# Patient Record
Sex: Female | Born: 1955 | Hispanic: Yes | Marital: Single | Smoking: Never smoker
Health system: Southern US, Community
[De-identification: ages and names within clinical notes are randomized; demographics above are authoritative.]

---

## 2010-05-03 HISTORY — PX: STOMACH SURGERY: SHX791

## 2021-01-10 ENCOUNTER — Emergency Department (HOSPITAL_COMMUNITY)
Admission: EM | Admit: 2021-01-10 | Discharge: 2021-01-10 | Disposition: A | Discharge status: Home / Self Care | Payer: Self-pay | Attending: Emergency Medicine | Admitting: Emergency Medicine

## 2021-01-10 ENCOUNTER — Encounter (HOSPITAL_COMMUNITY): Payer: Self-pay

## 2021-01-10 ENCOUNTER — Emergency Department (HOSPITAL_COMMUNITY): Payer: Self-pay

## 2021-01-10 ENCOUNTER — Other Ambulatory Visit: Payer: Self-pay

## 2021-01-10 DIAGNOSIS — R791 Abnormal coagulation profile: Secondary | ICD-10-CM | POA: Insufficient documentation

## 2021-01-10 DIAGNOSIS — R519 Headache, unspecified: Secondary | ICD-10-CM | POA: Insufficient documentation

## 2021-01-10 DIAGNOSIS — M4802 Spinal stenosis, cervical region: Secondary | ICD-10-CM | POA: Insufficient documentation

## 2021-01-10 LAB — CBC
HCT: 36 % (ref 36.0–46.0)
Hemoglobin: 11.1 g/dL — ABNORMAL LOW (ref 12.0–15.0)
MCH: 28 pg (ref 26.0–34.0)
MCHC: 30.8 g/dL (ref 30.0–36.0)
MCV: 90.7 fL (ref 80.0–100.0)
Platelets: 277 10*3/uL (ref 150–400)
RBC: 3.97 MIL/uL (ref 3.87–5.11)
RDW: 15 % (ref 11.5–15.5)
WBC: 4.9 10*3/uL (ref 4.0–10.5)
nRBC: 0 % (ref 0.0–0.2)

## 2021-01-10 LAB — URINALYSIS, MICROSCOPIC (REFLEX)

## 2021-01-10 LAB — DIFFERENTIAL
Abs Immature Granulocytes: 0.02 10*3/uL (ref 0.00–0.07)
Basophils Absolute: 0 10*3/uL (ref 0.0–0.1)
Basophils Relative: 0 %
Eosinophils Absolute: 0.1 10*3/uL (ref 0.0–0.5)
Eosinophils Relative: 2 %
Immature Granulocytes: 0 %
Lymphocytes Relative: 21 %
Lymphs Abs: 1 10*3/uL (ref 0.7–4.0)
Monocytes Absolute: 0.5 10*3/uL (ref 0.1–1.0)
Monocytes Relative: 10 %
Neutro Abs: 3.3 10*3/uL (ref 1.7–7.7)
Neutrophils Relative %: 67 %

## 2021-01-10 LAB — COMPREHENSIVE METABOLIC PANEL
ALT: 36 U/L (ref 0–44)
AST: 25 U/L (ref 15–41)
Albumin: 3.7 g/dL (ref 3.5–5.0)
Alkaline Phosphatase: 85 U/L (ref 38–126)
Anion gap: 3 — ABNORMAL LOW (ref 5–15)
BUN: 10 mg/dL (ref 8–23)
CO2: 27 mmol/L (ref 22–32)
Calcium: 8.4 mg/dL — ABNORMAL LOW (ref 8.9–10.3)
Chloride: 109 mmol/L (ref 98–111)
Creatinine, Ser: 0.58 mg/dL (ref 0.44–1.00)
GFR, Estimated: >60 mL/min (ref >60)
Glucose, Bld: 70 mg/dL (ref 70–99)
Potassium: 3.8 mmol/L (ref 3.5–5.1)
Sodium: 139 mmol/L (ref 135–145)
Total Bilirubin: 0.3 mg/dL (ref 0.3–1.2)
Total Protein: 6.6 g/dL (ref 6.5–8.1)

## 2021-01-10 LAB — URINALYSIS, ROUTINE W REFLEX MICROSCOPIC
Bilirubin Urine: NEGATIVE
Glucose, UA: >=500 mg/dL — AB
Hgb urine dipstick: NEGATIVE
Ketones, ur: NEGATIVE mg/dL
Leukocytes,Ua: NEGATIVE
Nitrite: POSITIVE — AB
Protein, ur: NEGATIVE mg/dL
Specific Gravity, Urine: 1.015 (ref 1.005–1.030)
pH: 6 (ref 5.0–8.0)

## 2021-01-10 LAB — PROTIME-INR
INR: 0.9 (ref 0.8–1.2)
Prothrombin Time: 12.5 seconds (ref 11.4–15.2)

## 2021-01-10 LAB — RAPID URINE DRUG SCREEN, HOSP PERFORMED
Amphetamines: NOT DETECTED
Barbiturates: NOT DETECTED
Benzodiazepines: NOT DETECTED
Cocaine: NOT DETECTED
Opiates: NOT DETECTED
Tetrahydrocannabinol: NOT DETECTED

## 2021-01-10 LAB — APTT: aPTT: 27 seconds (ref 24–36)

## 2021-01-10 IMAGING — MR MR HEAD WO/W CM
13 of 19 series · 33 of 48 positions shown · IV contrast (gadavist)
Comparison: CT from earlier the same day.

CLINICAL DATA: Initial evaluation for dizziness, neck pain,
headache, right upper extremity paresthesias and weakness.

EXAM:
MRI HEAD WITHOUT AND WITH CONTRAST
MRI CERVICAL SPINE WITHOUT AND WITH CONTRAST
TECHNIQUE: Multiplanar, multiecho pulse sequences of the brain and surrounding
structures, and cervical spine, to include the craniocervical
junction and cervicothoracic junction, were obtained without and
with intravenous contrast.
CONTRAST:  8.7mL GADAVIST GADOBUTROL 1 MMOL/ML IV SOLN

[Series 5: DWI · axial · 3.0mm · 0.88mm/px · z∈[-117,+33]mm · 5 of 104 slices shown (1 of 4)]
[im 1/104]
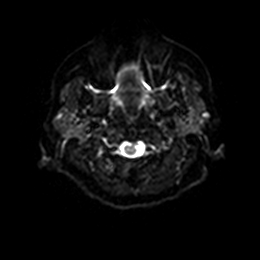
[im 26/104]
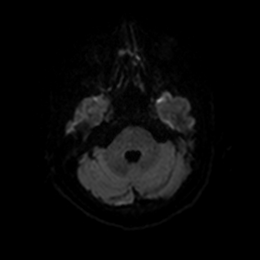
[im 52/104]
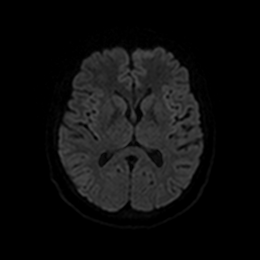
[im 78/104]
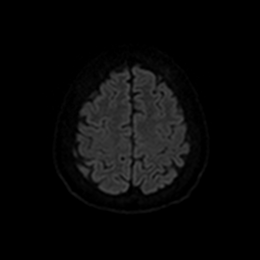
[im 104/104]
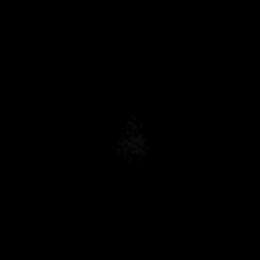

[Series 6: DWI · axial · 3.0mm · 0.88mm/px · z∈[-117,+33]mm · 3 of 52 slices shown (2 of 4)]
[im 1/52]
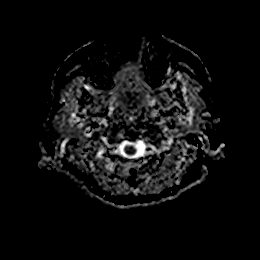
[im 26/52]
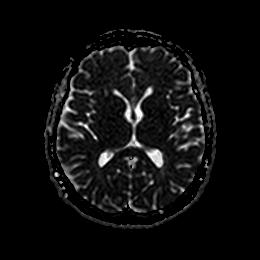
[im 52/52]
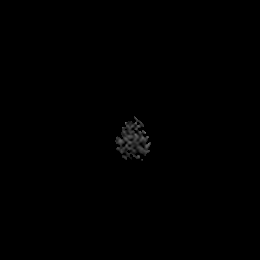

[Series 7: DWI · coronal · 4.0mm · 0.88mm/px · 4 of 70 slices shown (3 of 4)]
[im 1/70]
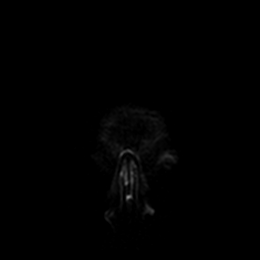
[im 24/70]
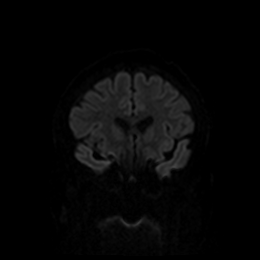
[im 47/70]
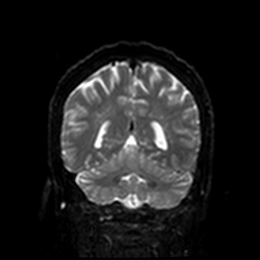
[im 70/70]
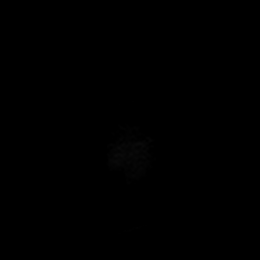

[Series 8: DWI · coronal · 4.0mm · 0.88mm/px · 2 of 35 slices shown (4 of 4)]
[im 1/35]
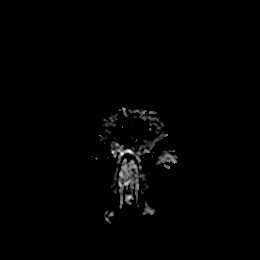
[im 35/35]
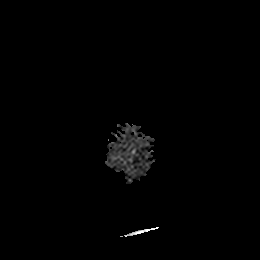

[Series 9: T1 · sagittal · 5.0mm · 0.75mm/px · 1 of 25 slices shown]
[im 1/25]
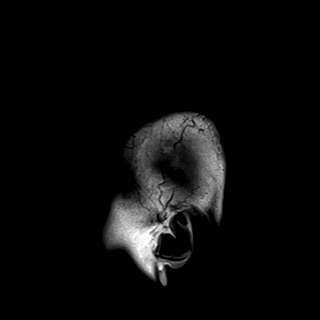

[Series 10: T2 · axial · 5.0mm · 0.72mm/px · 1 of 26 slices shown]
[im 1/26]
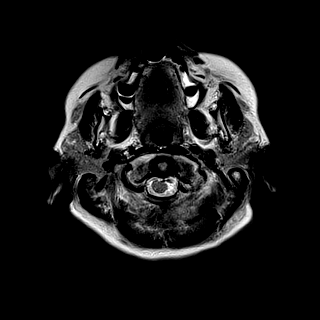

[Series 11: FLAIR · axial · 5.0mm · 0.45mm/px · 1 of 26 slices shown]
[im 1/26]
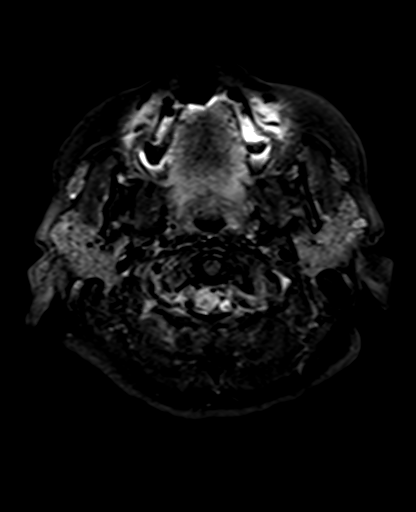

[Series 12: mag_images · axial · 3.0mm · 0.90mm/px · z∈[-117,+33]mm · 3 of 52 slices shown]
[im 1/52]
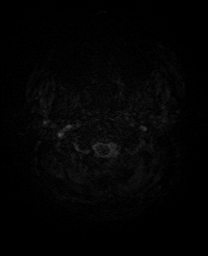
[im 26/52]
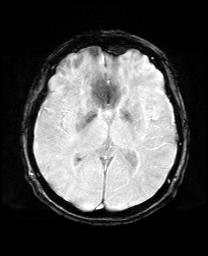
[im 52/52]
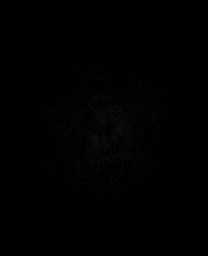

[Series 13: pha_images · axial · 3.0mm · 0.90mm/px · z∈[-117,+33]mm · 3 of 51 slices shown]
[im 1/51]
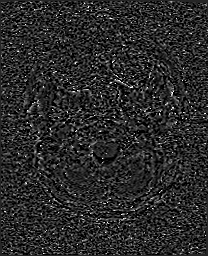
[im 26/51]
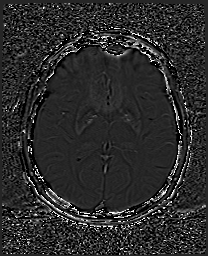
[im 51/51]
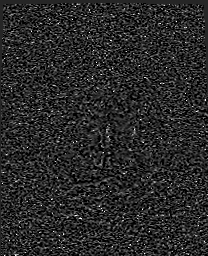

[Series 14: swi_images · axial · 3.0mm · 0.90mm/px · z∈[-117,+33]mm · 3 of 52 slices shown]
[im 1/52]
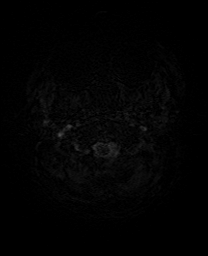
[im 26/52]
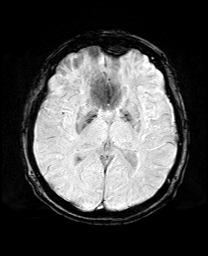
[im 52/52]
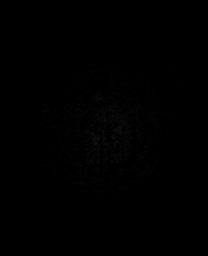

[Series 15: mip_images(sw) · axial · 24.0mm · 0.90mm/px · z∈[-106,+23]mm · 3 of 45 slices shown]
[im 1/45]
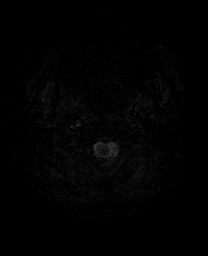
[im 23/45]
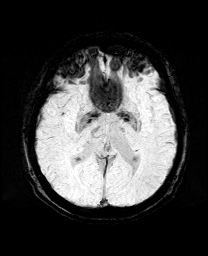
[im 45/45]
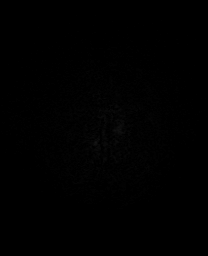

[Series 17: T2 post-contrast · coronal · 5.0mm · 0.72mm/px · 2 of 29 slices shown]
[im 1/29]
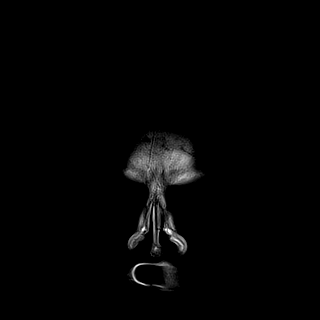
[im 29/29]
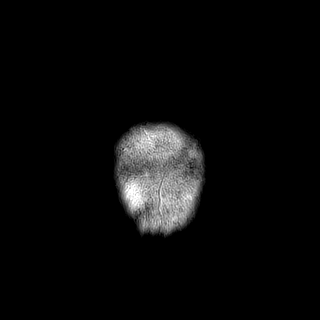

[Series 19: T1 post-contrast · coronal · 5.0mm · 0.34mm/px · 2 of 29 slices shown]
[im 1/29]
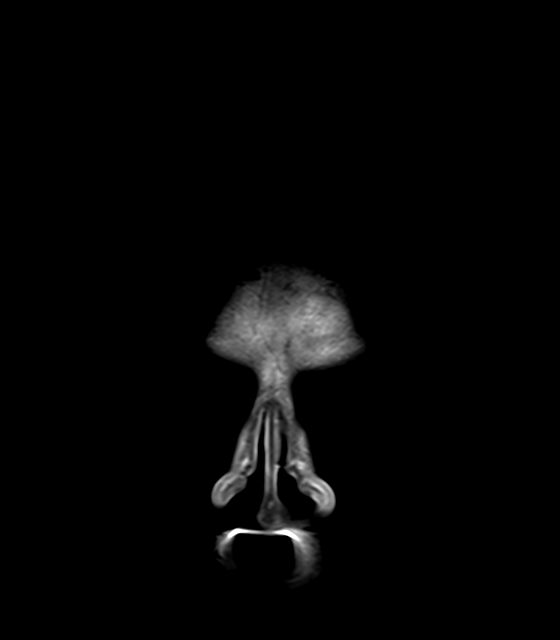
[im 29/29]
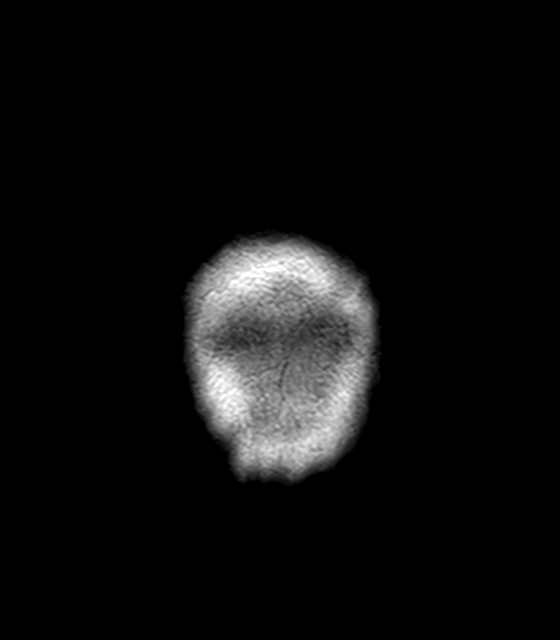

[33 of 48 positions shown; findings below may reference images not displayed]

FINDINGS: MRI HEAD FINDINGS

Brain: Cerebral volume within normal limits. Scattered patchy
T2/FLAIR hyperintensity seen involving the periventricular and deep
white matter both cerebral hemispheres, nonspecific, but most like
related to chronic microvascular ischemic disease, mild in nature.

No abnormal foci of restricted diffusion to suggest acute or
subacute ischemia. Gray-white matter differentiation maintained. No
encephalomalacia to suggest chronic cortical infarction. No evidence
for acute or chronic intracranial hemorrhage.

No mass lesion, midline shift or mass effect. No hydrocephalus or
extra-axial fluid collection. Partially empty sella. Suprasellar
region normal. Midline structures intact. No abnormal enhancement.

Vascular: Major intracranial vascular flow voids are maintained.

Skull and upper cervical spine: Craniocervical junction within
normal limits. Bone marrow signal intensity normal. No worrisome
focal marrow replacing lesion. No scalp soft tissue abnormality.

Sinuses/Orbits: Globes and orbital soft tissues within normal
limits. Scattered mucosal thickening noted throughout the paranasal
sinuses. No air-fluid levels to suggest acute sinusitis. Trace
bilateral mastoid effusions, of doubtful significance. Inner ear
structures grossly normal. Visualized nasopharynx unremarkable.

Other: 5 mm T2 hyperintense lesion noted within the right parotid
gland, of doubtful significance.

MRI CERVICAL SPINE FINDINGS

Alignment: Straightening of the normal cervical lordosis. No
listhesis.

Vertebrae: Vertebral body height maintained without acute or chronic
fracture. Bone marrow signal intensity mildly heterogeneous with a
few scattered benign hemangiomata noted. No worrisome osseous
lesions. No abnormal marrow edema or enhancement.

Cord: Normal signal and morphology.  No abnormal enhancement.

Posterior Fossa, vertebral arteries, paraspinal tissues: Visualized
brain and posterior fossa within normal limits. Craniocervical
junction normal. Paraspinous and prevertebral soft tissues within
normal limits. Normal intravascular flow voids seen within the
vertebral arteries bilaterally.

Disc levels:

C2-C3: Unremarkable.

C3-C4: Degenerative intervertebral disc space narrowing. Left
paracentral disc osteophyte complex indents the left ventral thecal
sac. Secondary mild flattening of the left ventral cord without cord
signal changes. Mild to moderate spinal stenosis. Mild to moderate
left C4 foraminal stenosis. Right neural foramina remains patent.

C4-C5: Degenerative intervertebral disc space narrowing. Broad-based
posterior disc osteophyte complex flattens and effaces the ventral
thecal sac. Moderate spinal stenosis with mild cord flattening, but
no cord signal changes. Mild left C5 foraminal stenosis. Right
neural foramina remains patent.

C5-C6: Degenerative intervertebral disc space narrowing. Left
paracentral disc osteophyte complex indents the left ventral thecal
sac. Mild spinal stenosis with minimal cord flattening, but no cord
signal changes. Mild to moderate left C6 foraminal narrowing. Right
neural foramina remains patent.

C6-C7: Degenerative intervertebral disc space narrowing. Broad-based
posterior disc osteophyte flattens and partially faces the ventral
thecal sac with no more than mild spinal stenosis. No cord
impingement. Mild left C7 foraminal stenosis. Right neural foramina
remains patent.

C7-T1: Negative interspace. Bilateral facet hypertrophy. No
stenosis.

T1-2: Seen only on sagittal projection. Minimal disc bulge with
facet hypertrophy. No stenosis.
IMPRESSION: MRI HEAD IMPRESSION:

1. No acute intracranial abnormality.
2. Mild cerebral white matter changes, nonspecific, but most like
related to chronic microvascular ischemic disease.

MRI CERVICAL SPINE IMPRESSION:

1. No acute abnormality within the cervical spine. No worrisome
osseous lesions by MRI.
2. Moderate multilevel cervical spondylosis with resultant mild to
moderate diffuse spinal stenosis at C3-4 through C6-7.
3. Mild to moderate left C4 through C7 foraminal stenosis as above.
No significant right-sided foraminal narrowing to explain patient's
right-sided symptoms.

## 2021-01-10 IMAGING — CT CT CERVICAL SPINE W/O CM
3 of 4 series · 13 of 33 positions shown, 16 images · non-contrast
Comparison: None.

CLINICAL DATA: Acute neck pain.

EXAM:
CT CERVICAL SPINE WITHOUT CONTRAST
TECHNIQUE: Multidetector CT imaging of the cervical spine was performed without
intravenous contrast. Multiplanar CT image reconstructions were also
generated.

[Series 3: c_spine 2.0 st · axial · 0.28mm/px · z∈[-149,-49]mm · 5 of 76 slices shown, 7 images]
[im 13/76  soft-tissue]
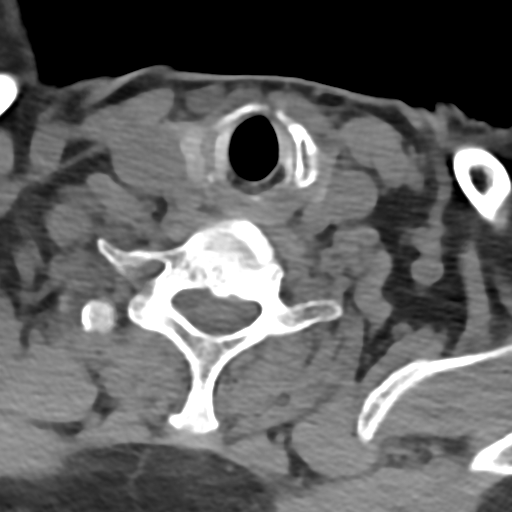
[im 13/76  bone]
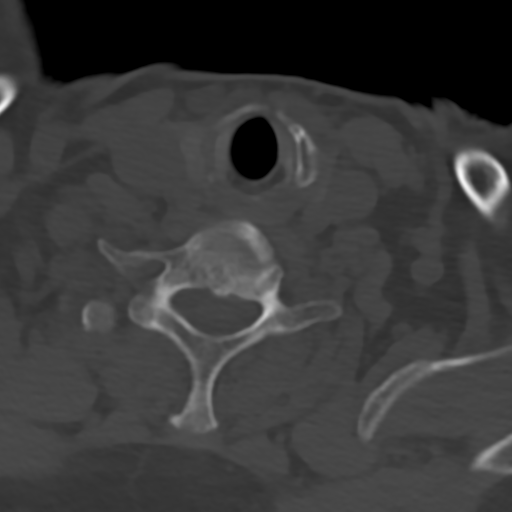
[im 26/76  bone]
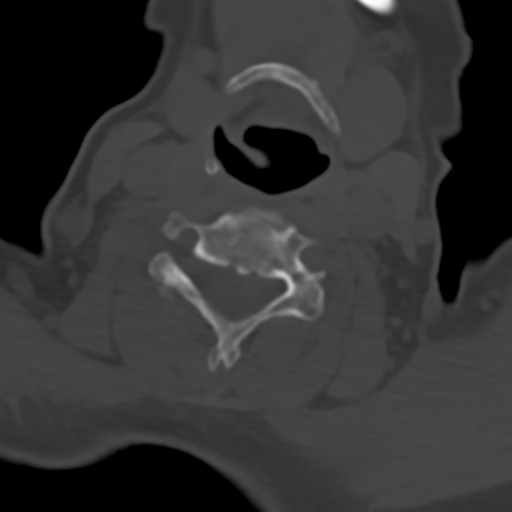
[im 38/76  bone]
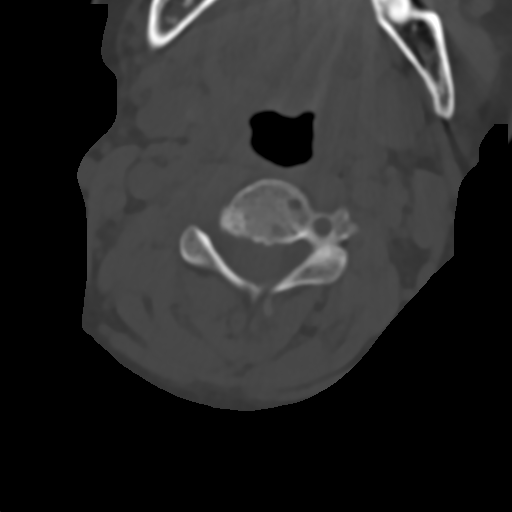
[im 51/76  bone]
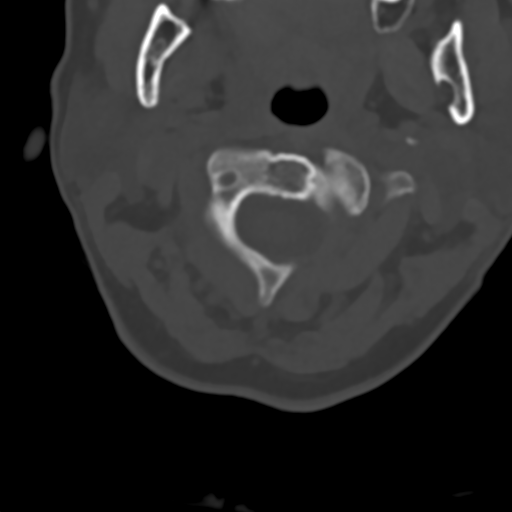
[im 63/76  soft-tissue]
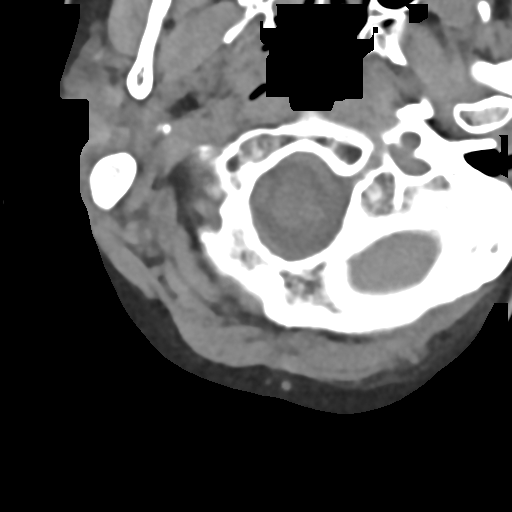
[im 63/76  bone]
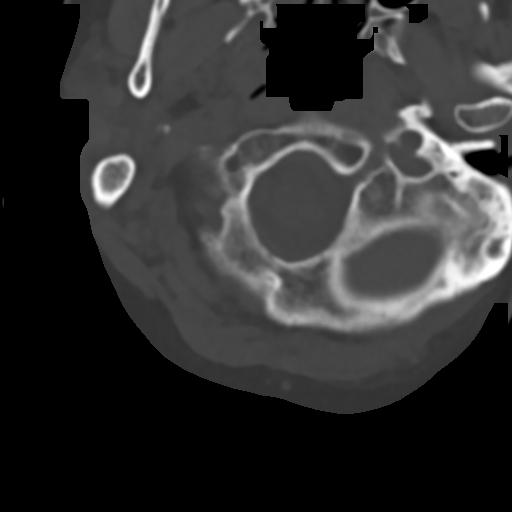

[Series 7: c_spine 2.0 sag bone · sagittal · 0.25mm/px · 5 of 61 slices shown, 6 images]
[im 21/61  bone]
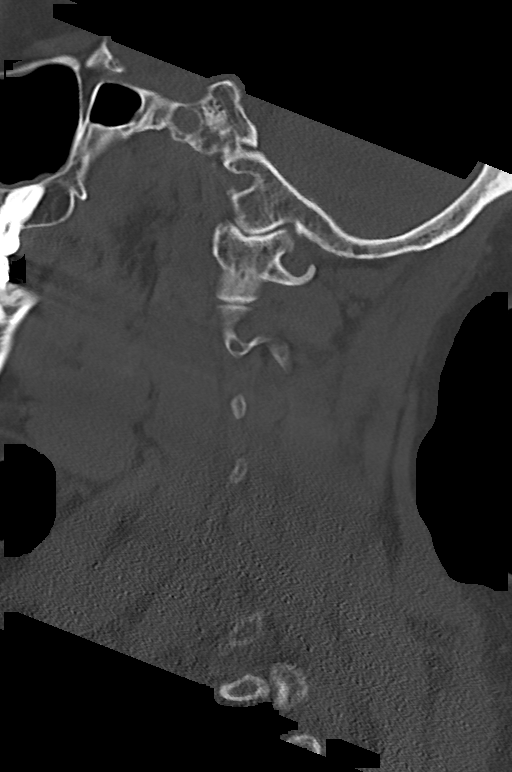
[im 26/61  bone]
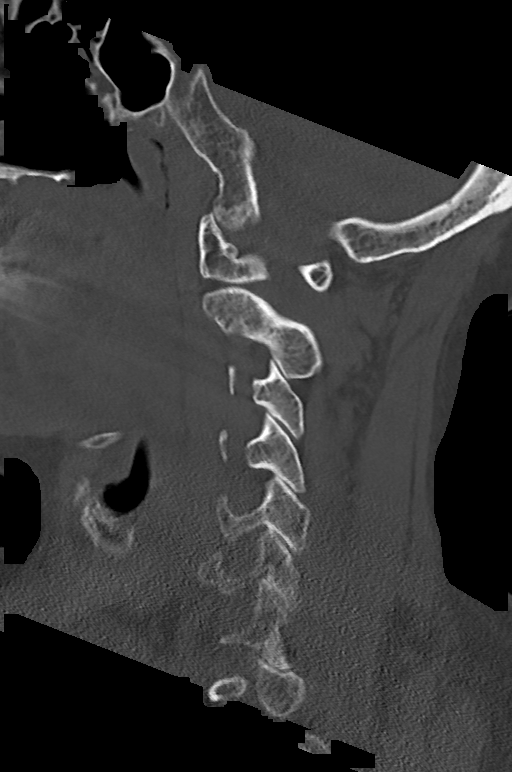
[im 31/61  soft-tissue]
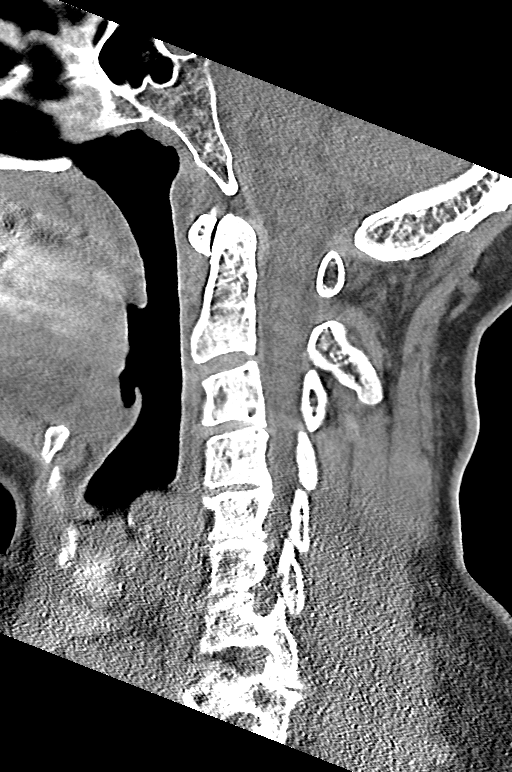
[im 31/61  bone]
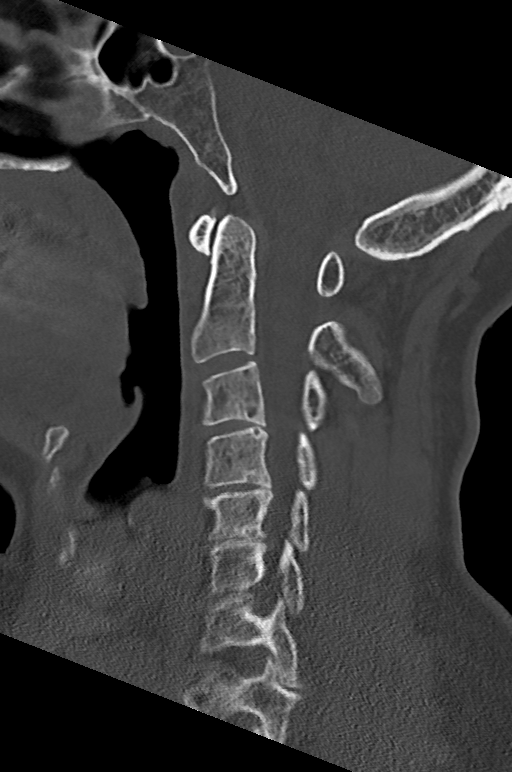
[im 36/61  bone]
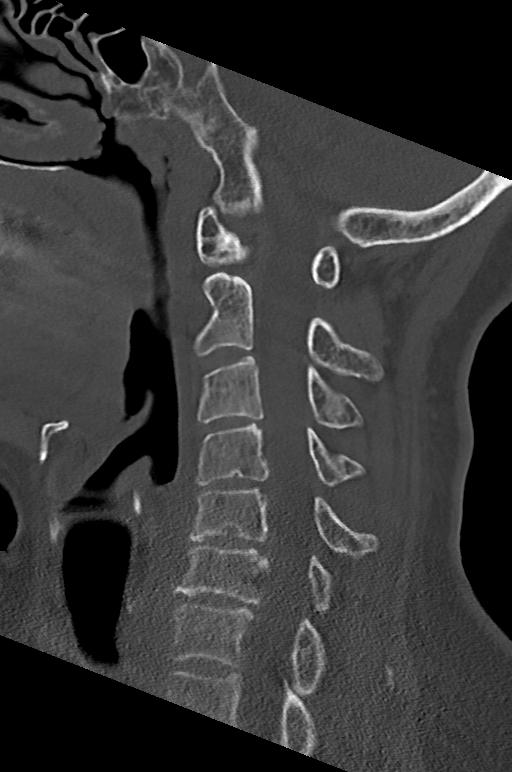
[im 41/61  bone]
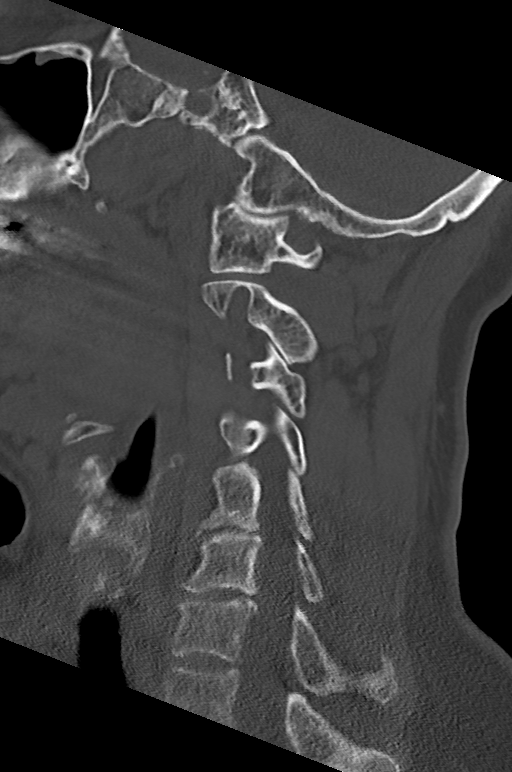

[Series 8: c_spine 2.0 cor bone · coronal · 0.27mm/px · 3 of 61 slices shown]
[im 13/61  bone]
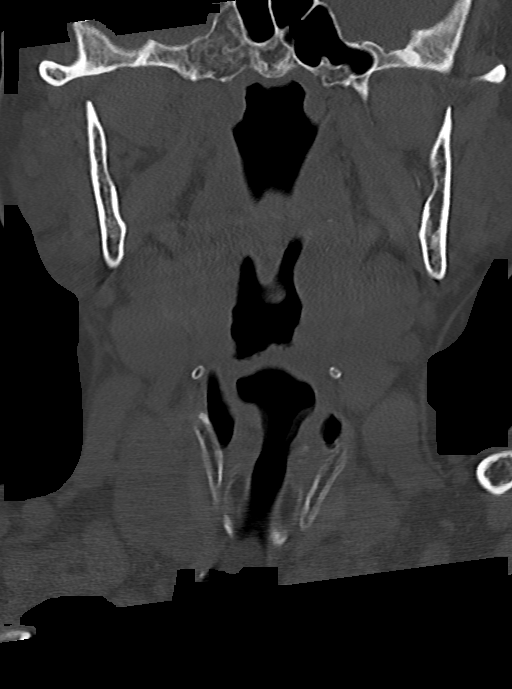
[im 25/61  bone]
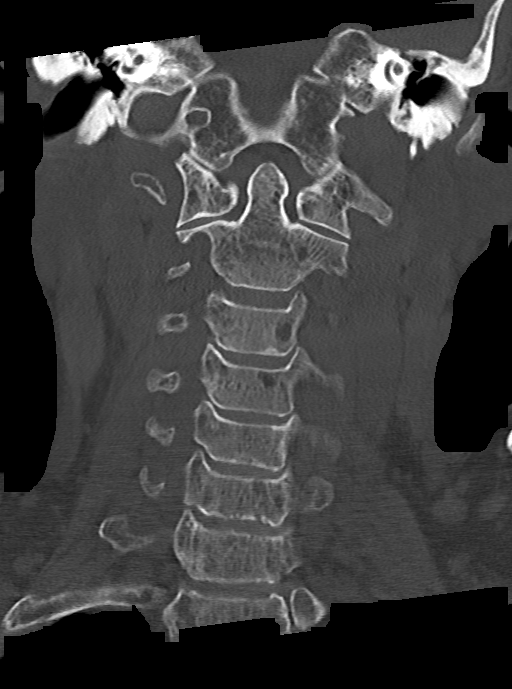
[im 37/61  bone]
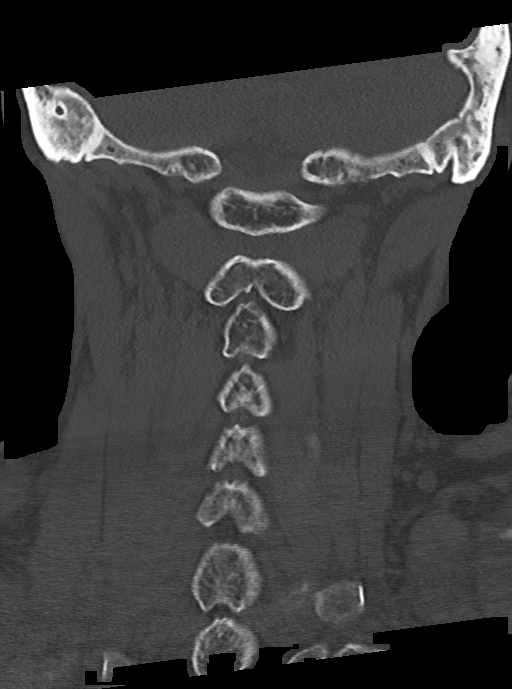

[13 of 33 positions shown; findings below may reference images not displayed]

FINDINGS: Alignment: Reversal of cervical lordosis, likely positional.

Skull base and vertebrae: No acute fracture. Multiple apparent
lucencies throughout the vertebral bodies of the cervical spine.
These may be due to osteopenia or represent lytic bone lesions.

Soft tissues and spinal canal: No prevertebral fluid or swelling. No
visible canal hematoma.

Disc levels:  Multilevel osteoarthritic changes, mild.

Upper chest: Negative.

Other: None.
IMPRESSION: 1. No evidence of acute traumatic injury to the cervical spine.
2. Multiple apparent lucencies throughout the vertebral bodies of
the cervical spine. These may be due to osteopenia or represent
lytic bone lesions. Clinical correlation is recommended.
3. Multilevel osteoarthritic changes of the cervical spine.

## 2021-01-10 IMAGING — MR MR CERVICAL SPINE WO/W CM
8 of 15 series · 20 of 48 positions shown · IV contrast (gadavist)
Comparison: CT from earlier the same day.

CLINICAL DATA: Initial evaluation for dizziness, neck pain,
headache, right upper extremity paresthesias and weakness.

EXAM:
MRI HEAD WITHOUT AND WITH CONTRAST
MRI CERVICAL SPINE WITHOUT AND WITH CONTRAST
TECHNIQUE: Multiplanar, multiecho pulse sequences of the brain and surrounding
structures, and cervical spine, to include the craniocervical
junction and cervicothoracic junction, were obtained without and
with intravenous contrast.
CONTRAST:  8.7mL GADAVIST GADOBUTROL 1 MMOL/ML IV SOLN

[Series 9: T2 · sagittal · 3.0mm · 0.69mm/px · 1 of 15 slices shown (1 of 2)]
[im 1/15]
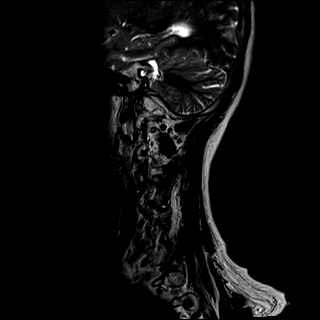

[Series 10: T1 · sagittal · 3.0mm · 0.69mm/px · 1 of 15 slices shown (1 of 2)]
[im 1/15]
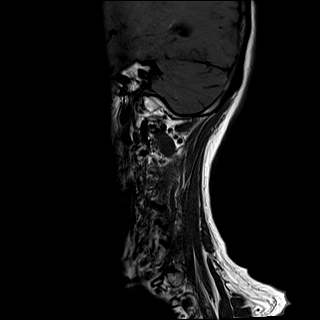

[Series 11: STIR · sagittal · 3.0mm · 0.86mm/px · 1 of 15 slices shown]
[im 1/15]
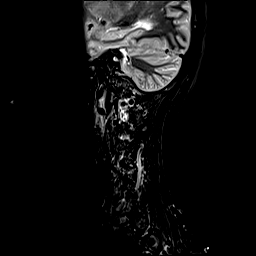

[Series 12: T2 · axial · 3.0mm · 0.66mm/px · z∈[-229,-110]mm · 4 of 40 slices shown (2 of 2)]
[im 1/40]
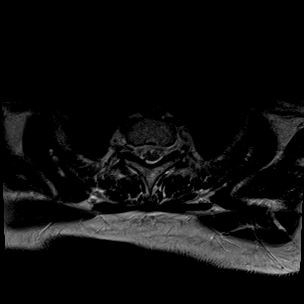
[im 14/40]
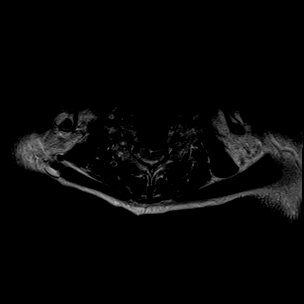
[im 27/40]
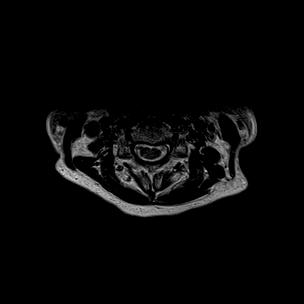
[im 40/40]
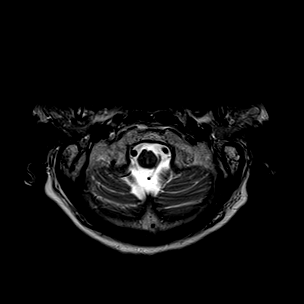

[Series 13: GRE · axial · 3.0mm · 0.39mm/px · z∈[-229,-110]mm · 4 of 40 slices shown]
[im 1/40]
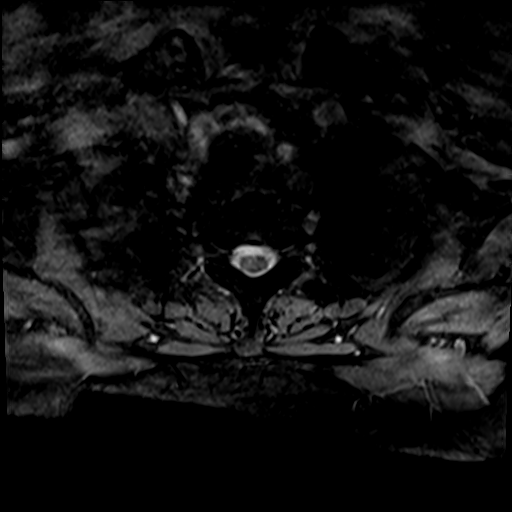
[im 14/40]
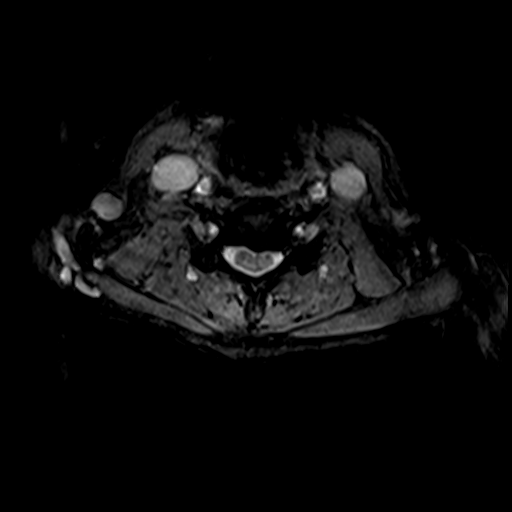
[im 27/40]
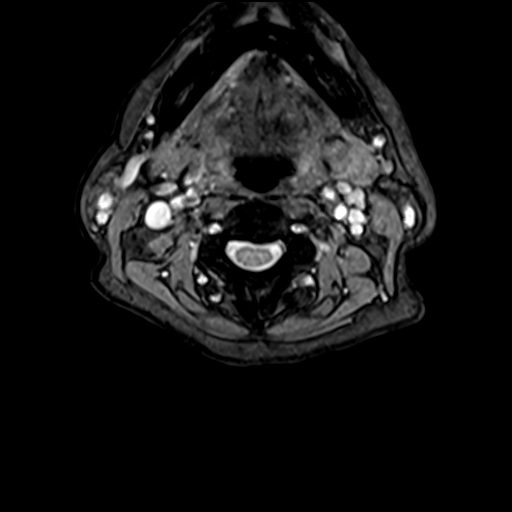
[im 40/40]
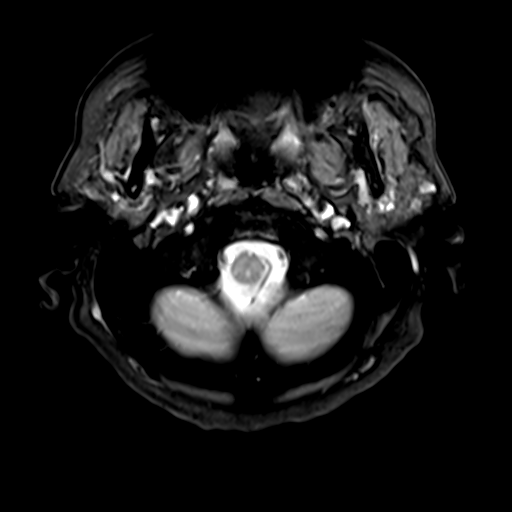

[Series 14: T1 · axial · 3.0mm · 0.39mm/px · z∈[-229,-110]mm · 4 of 40 slices shown (2 of 2)]
[im 1/40]
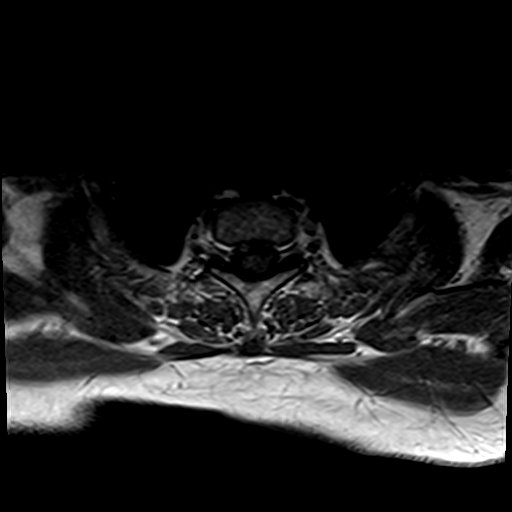
[im 14/40]
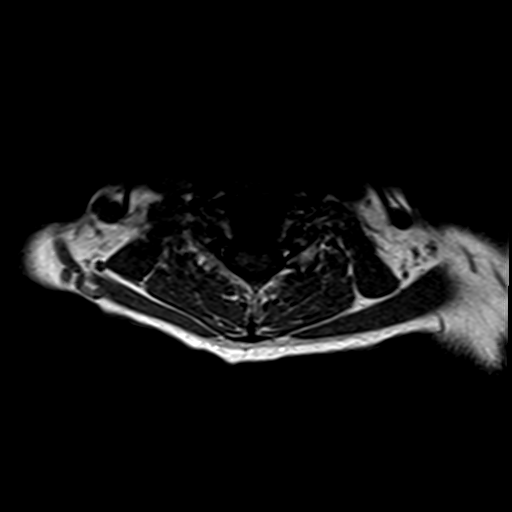
[im 27/40]
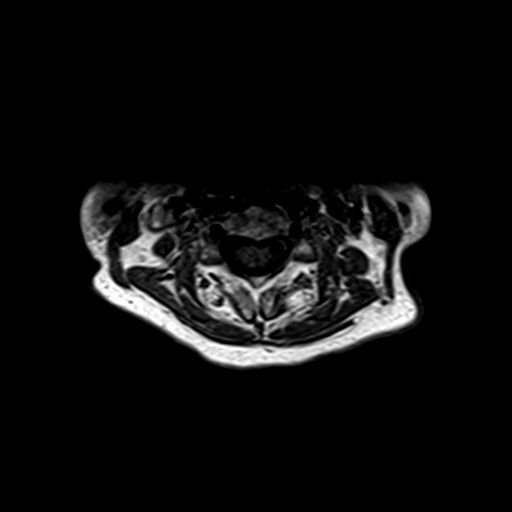
[im 40/40]
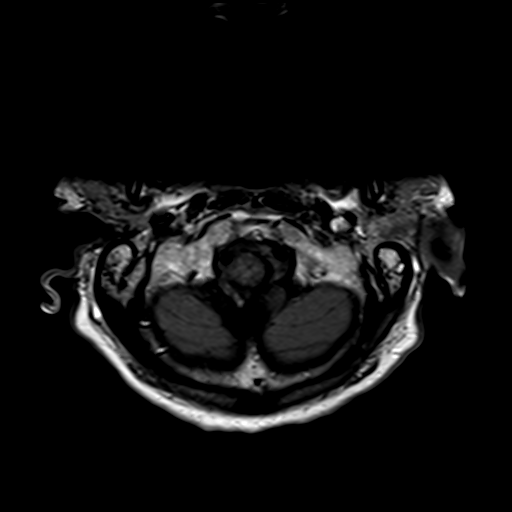

[Series 15: T1 fat-sat post-contrast · sagittal · 3.0mm · 0.43mm/px · 1 of 15 slices shown]
[im 1/15]
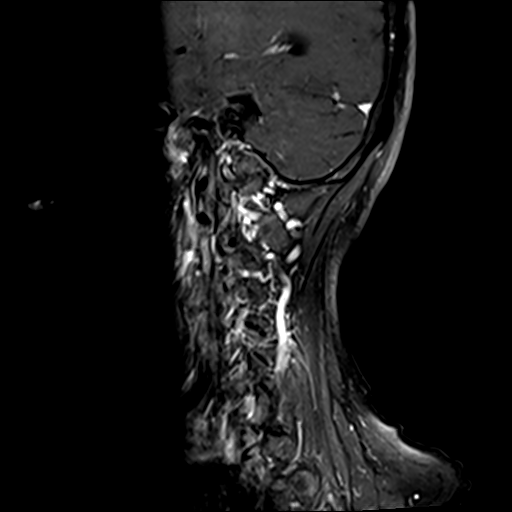

[Series 16: T1 post-contrast · axial · 3.0mm · 0.39mm/px · z∈[-229,-110]mm · 4 of 40 slices shown]
[im 1/40]
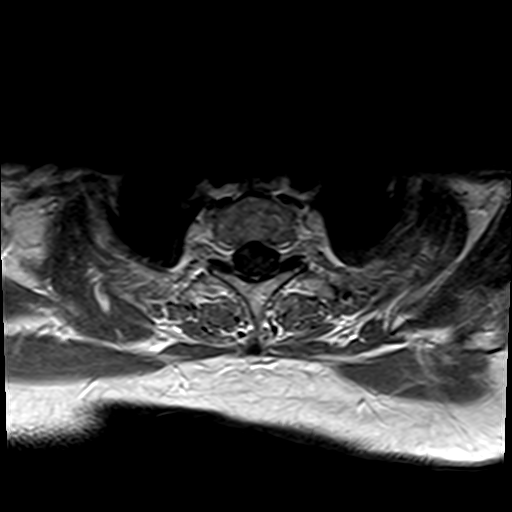
[im 14/40]
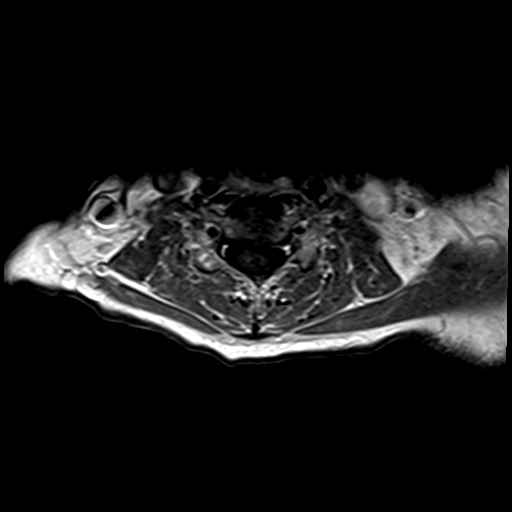
[im 27/40]
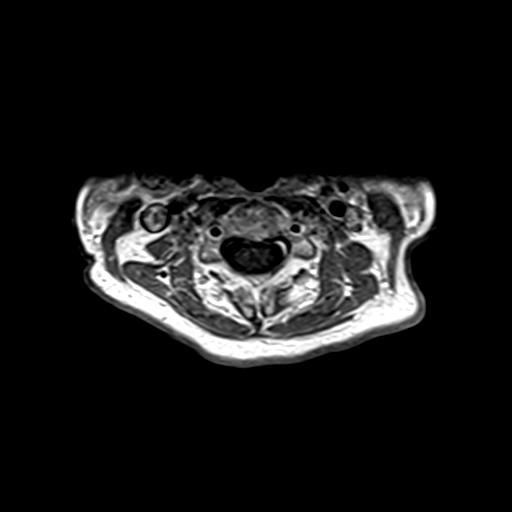
[im 40/40]
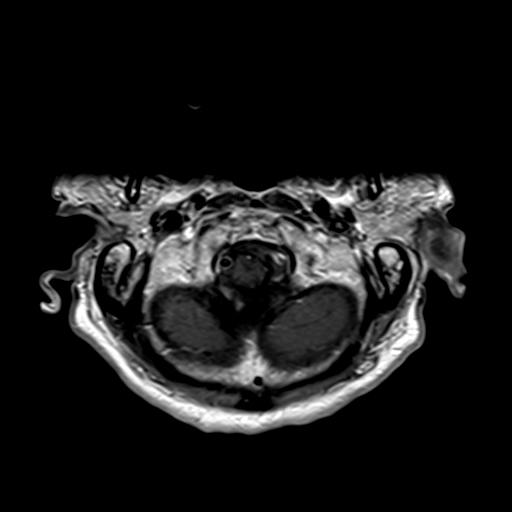

[20 of 48 positions shown; findings below may reference images not displayed]

FINDINGS: MRI HEAD FINDINGS

Brain: Cerebral volume within normal limits. Scattered patchy
T2/FLAIR hyperintensity seen involving the periventricular and deep
white matter both cerebral hemispheres, nonspecific, but most like
related to chronic microvascular ischemic disease, mild in nature.

No abnormal foci of restricted diffusion to suggest acute or
subacute ischemia. Gray-white matter differentiation maintained. No
encephalomalacia to suggest chronic cortical infarction. No evidence
for acute or chronic intracranial hemorrhage.

No mass lesion, midline shift or mass effect. No hydrocephalus or
extra-axial fluid collection. Partially empty sella. Suprasellar
region normal. Midline structures intact. No abnormal enhancement.

Vascular: Major intracranial vascular flow voids are maintained.

Skull and upper cervical spine: Craniocervical junction within
normal limits. Bone marrow signal intensity normal. No worrisome
focal marrow replacing lesion. No scalp soft tissue abnormality.

Sinuses/Orbits: Globes and orbital soft tissues within normal
limits. Scattered mucosal thickening noted throughout the paranasal
sinuses. No air-fluid levels to suggest acute sinusitis. Trace
bilateral mastoid effusions, of doubtful significance. Inner ear
structures grossly normal. Visualized nasopharynx unremarkable.

Other: 5 mm T2 hyperintense lesion noted within the right parotid
gland, of doubtful significance.

MRI CERVICAL SPINE FINDINGS

Alignment: Straightening of the normal cervical lordosis. No
listhesis.

Vertebrae: Vertebral body height maintained without acute or chronic
fracture. Bone marrow signal intensity mildly heterogeneous with a
few scattered benign hemangiomata noted. No worrisome osseous
lesions. No abnormal marrow edema or enhancement.

Cord: Normal signal and morphology.  No abnormal enhancement.

Posterior Fossa, vertebral arteries, paraspinal tissues: Visualized
brain and posterior fossa within normal limits. Craniocervical
junction normal. Paraspinous and prevertebral soft tissues within
normal limits. Normal intravascular flow voids seen within the
vertebral arteries bilaterally.

Disc levels:

C2-C3: Unremarkable.

C3-C4: Degenerative intervertebral disc space narrowing. Left
paracentral disc osteophyte complex indents the left ventral thecal
sac. Secondary mild flattening of the left ventral cord without cord
signal changes. Mild to moderate spinal stenosis. Mild to moderate
left C4 foraminal stenosis. Right neural foramina remains patent.

C4-C5: Degenerative intervertebral disc space narrowing. Broad-based
posterior disc osteophyte complex flattens and effaces the ventral
thecal sac. Moderate spinal stenosis with mild cord flattening, but
no cord signal changes. Mild left C5 foraminal stenosis. Right
neural foramina remains patent.

C5-C6: Degenerative intervertebral disc space narrowing. Left
paracentral disc osteophyte complex indents the left ventral thecal
sac. Mild spinal stenosis with minimal cord flattening, but no cord
signal changes. Mild to moderate left C6 foraminal narrowing. Right
neural foramina remains patent.

C6-C7: Degenerative intervertebral disc space narrowing. Broad-based
posterior disc osteophyte flattens and partially faces the ventral
thecal sac with no more than mild spinal stenosis. No cord
impingement. Mild left C7 foraminal stenosis. Right neural foramina
remains patent.

C7-T1: Negative interspace. Bilateral facet hypertrophy. No
stenosis.

T1-2: Seen only on sagittal projection. Minimal disc bulge with
facet hypertrophy. No stenosis.
IMPRESSION: MRI HEAD IMPRESSION:

1. No acute intracranial abnormality.
2. Mild cerebral white matter changes, nonspecific, but most like
related to chronic microvascular ischemic disease.

MRI CERVICAL SPINE IMPRESSION:

1. No acute abnormality within the cervical spine. No worrisome
osseous lesions by MRI.
2. Moderate multilevel cervical spondylosis with resultant mild to
moderate diffuse spinal stenosis at C3-4 through C6-7.
3. Mild to moderate left C4 through C7 foraminal stenosis as above.
No significant right-sided foraminal narrowing to explain patient's
right-sided symptoms.

## 2021-01-10 IMAGING — CT CT HEAD W/O CM
4 series · 17 of 47 positions shown, 19 images · non-contrast
Comparison: None.

CLINICAL DATA: Left-sided pain and numbness since [REDACTED], getting
worse.

EXAM:
CT HEAD WITHOUT CONTRAST
TECHNIQUE: Contiguous axial images were obtained from the base of the skull
through the vertex without intravenous contrast.

[Series 3: head without · axial · non-contrast · 0.39mm/px · z∈[-42,+78]mm · 7 of 34 slices shown, 9 images]
[im 5/34  brain]
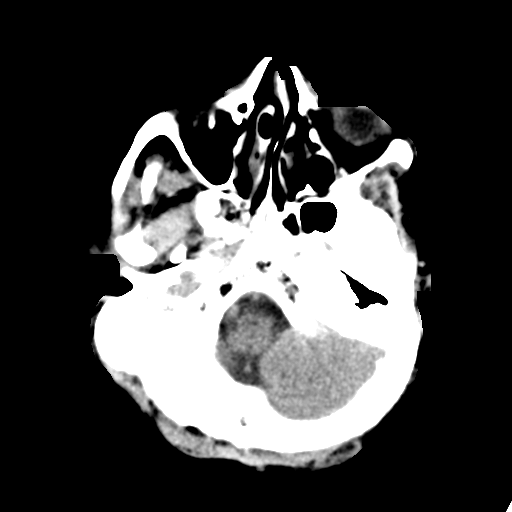
[im 5/34  bone]
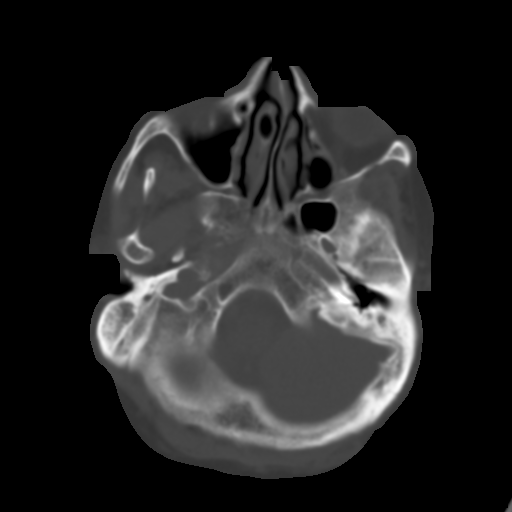
[im 9/34  brain]
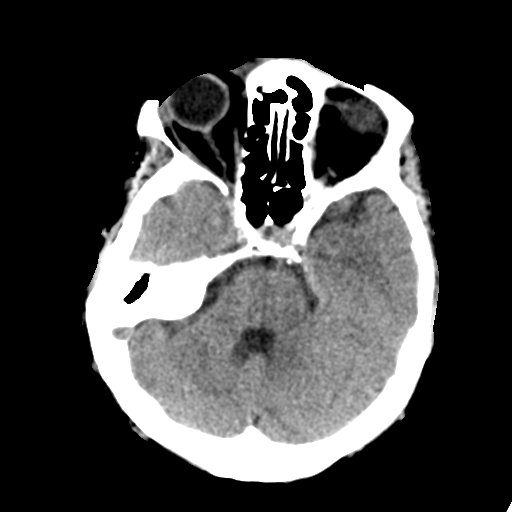
[im 13/34  brain]
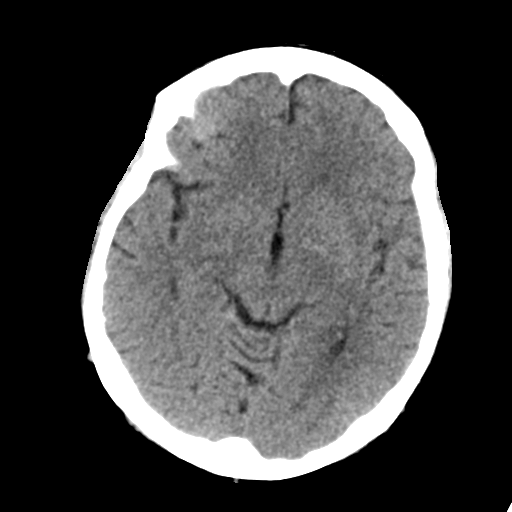
[im 17/34  brain]
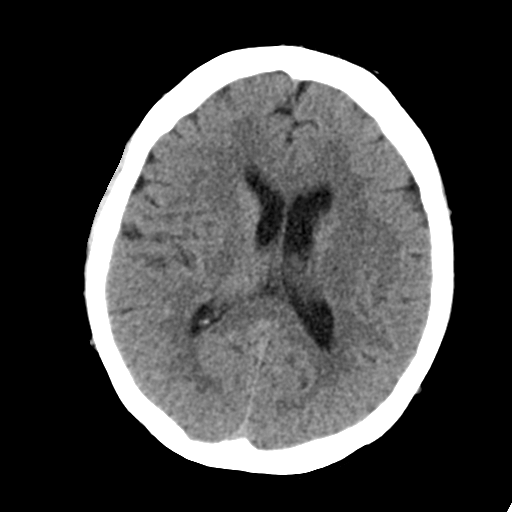
[im 21/34  brain]
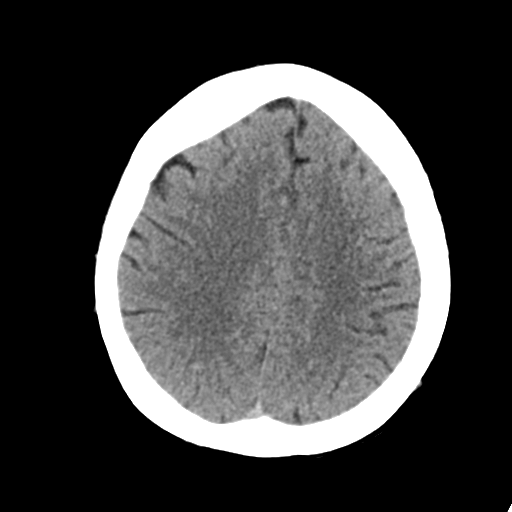
[im 21/34  bone]
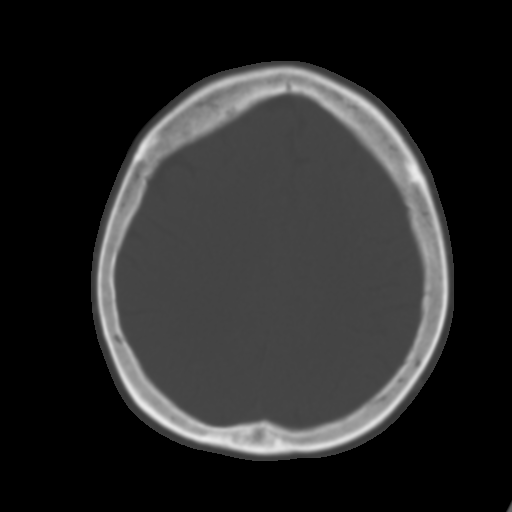
[im 25/34  brain]
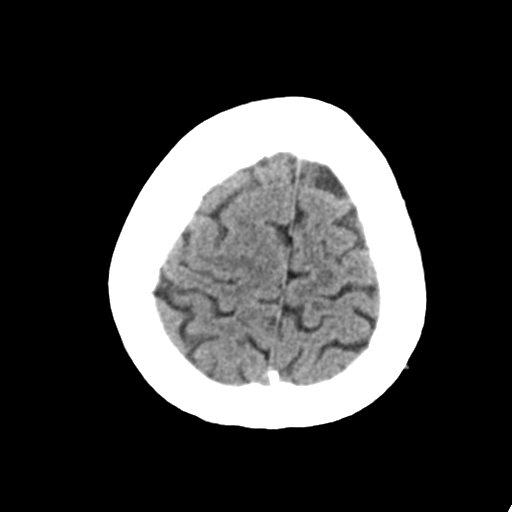
[im 29/34  brain]
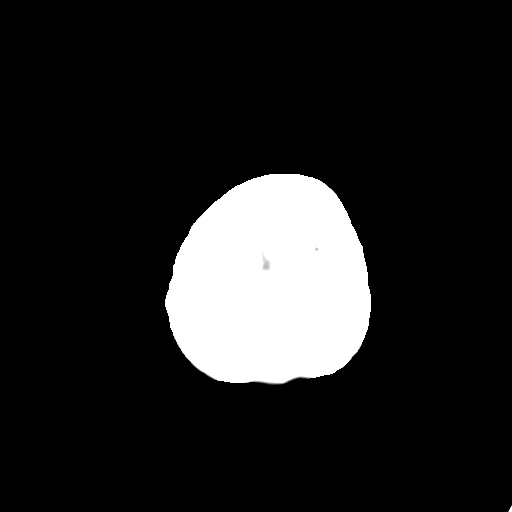

[Series 4: head bone · axial · 0.39mm/px · z∈[-46,+12]mm · 4 of 84 slices shown]
[im 9/84  bone]
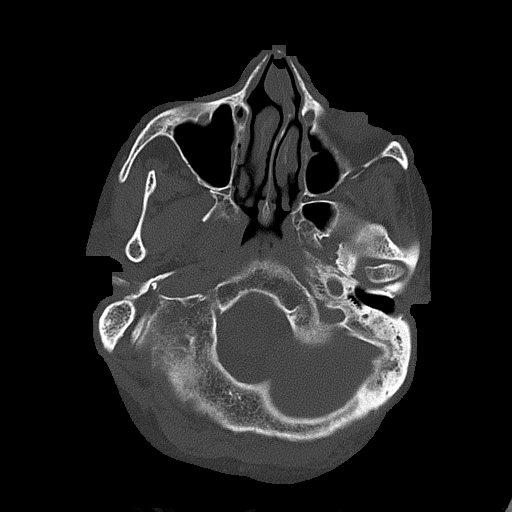
[im 17/84  bone]
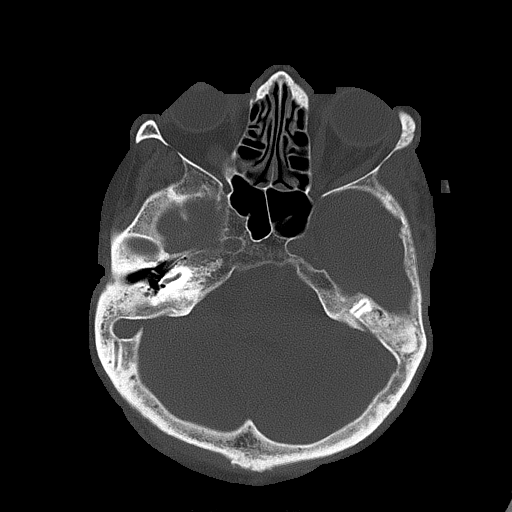
[im 25/84  bone]
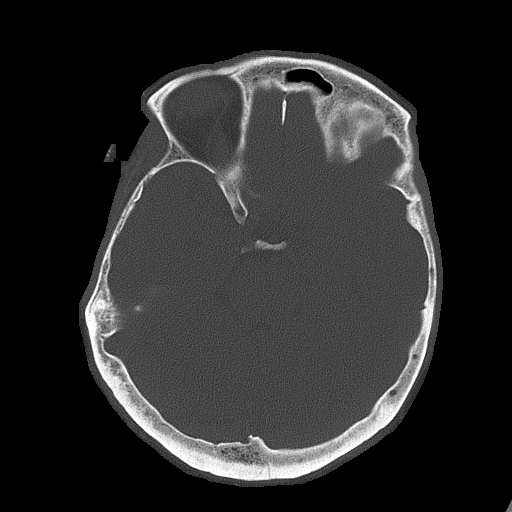
[im 38/84  bone]
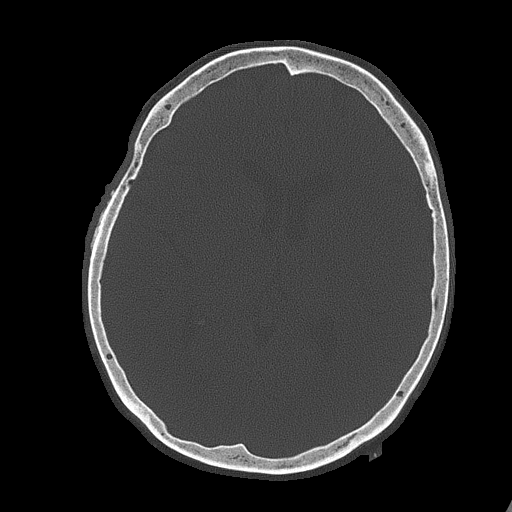

[Series 5: head without cor · coronal · non-contrast · 0.32mm/px · 3 of 67 slices shown]
[im 23/67  brain]
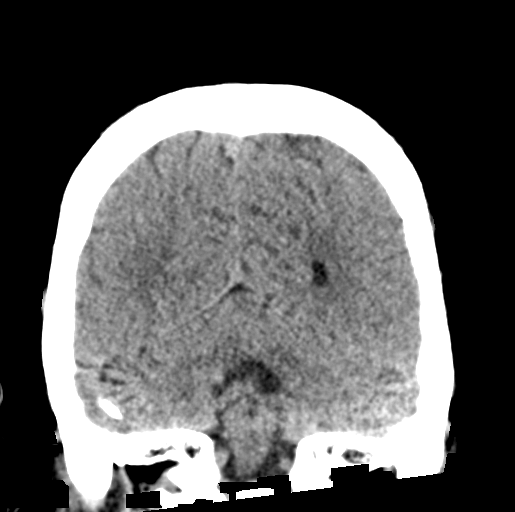
[im 30/67  brain]
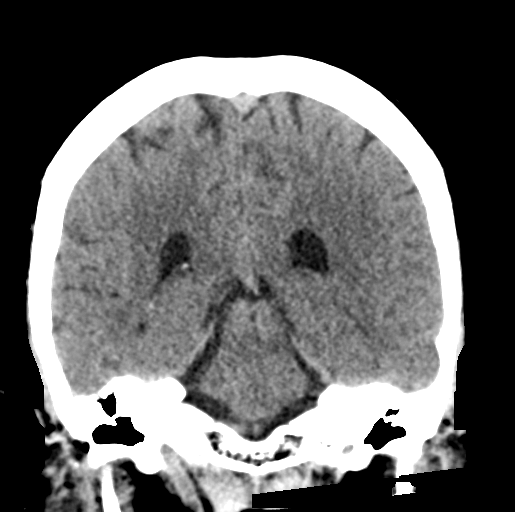
[im 37/67  brain]
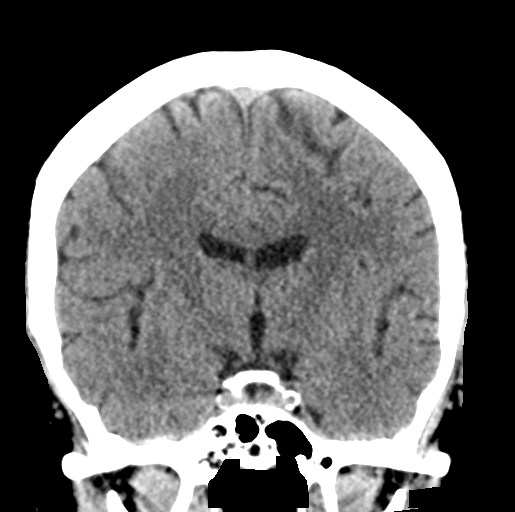

[Series 6: head without sag · sagittal · non-contrast · 0.32mm/px · 3 of 67 slices shown]
[im 23/67  brain]
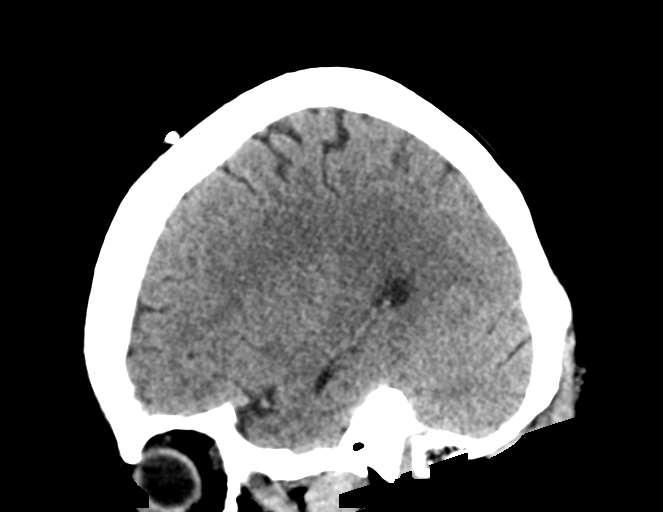
[im 34/67  brain]
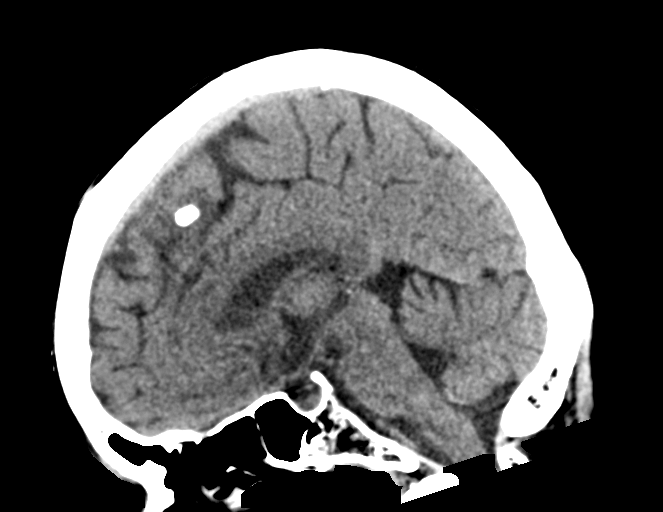
[im 45/67  brain]
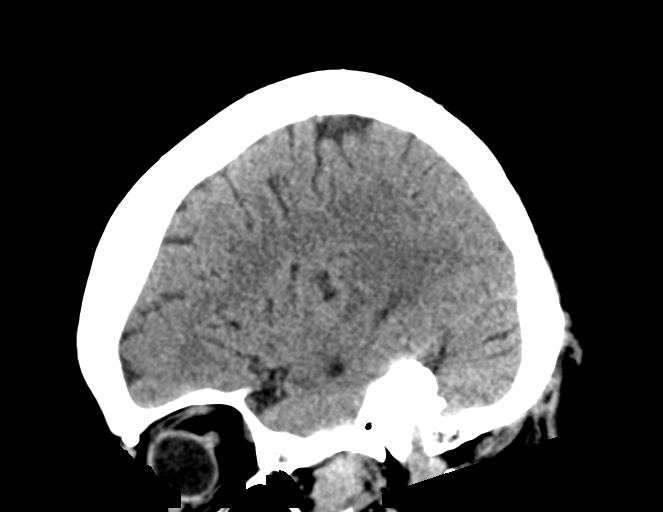

[17 of 47 positions shown; findings below may reference images not displayed]

FINDINGS: Brain: No evidence of acute infarction, hemorrhage, hydrocephalus,
extra-axial collection or mass lesion/mass effect. Suspected mild
deep white matter microangiopathy.

Vascular: No hyperdense vessel or unexpected calcification.

Skull: Normal. Negative for fracture or focal lesion.

Sinuses/Orbits: No acute finding.

Other: None.
IMPRESSION: 1. No acute intracranial abnormality.
2. Suspected mild deep white matter microangiopathy.

## 2021-01-10 MED ORDER — HYDROCODONE-ACETAMINOPHEN 5-325 MG PO TABS: 1.0000 | ORAL_TABLET | ORAL | 0 refills | Status: AC | PRN

## 2021-01-10 MED ORDER — GADOBUTROL 1 MMOL/ML IV SOLN
8.7000 mL | Freq: Once | INTRAVENOUS | Status: AC | PRN
  Administered 2021-01-10: 8.7 mL via INTRAVENOUS

## 2021-01-10 MED ORDER — HYDROMORPHONE HCL 1 MG/ML IJ SOLN
1.0000 mg | Freq: Once | INTRAMUSCULAR | Status: AC
  Administered 2021-01-10: 1 mg via INTRAVENOUS
  Filled 2021-01-10: qty 1

## 2021-01-10 MED ORDER — SODIUM CHLORIDE 0.9 % IV BOLUS
1000.0000 mL | Freq: Once | INTRAVENOUS | Status: AC
  Administered 2021-01-10: 1000 mL via INTRAVENOUS

## 2021-01-10 MED ORDER — IBUPROFEN 400 MG PO TABS: 400.0000 mg | ORAL_TABLET | Freq: Three times a day (TID) | ORAL | 0 refills | Status: DC | PRN

## 2021-01-10 MED ORDER — METHOCARBAMOL 500 MG PO TABS: 500.0000 mg | ORAL_TABLET | Freq: Two times a day (BID) | ORAL | 0 refills | Status: DC | PRN

## 2021-01-10 NOTE — ED Provider Notes (Signed)
[facility] EMERGENCY DEPARTMENT Provider Note   CSN: [id number] Arrival date & time: 01/10/21  1003     History Chief Complaint  Patient presents with   Neck Injury   Headache    Teresa Key Teresa Key is a 65 y.o. female.  HPI 65 year old female presents with a chief complaint of headache.  The patient has multiple symptoms and even with the Spanish interpreter it is hard to follow.  However she indicates that she started feeling poorly 5 days ago.  She has been having continuous symptoms including neck pain both left and right, but mostly on the right.  She is also been having electric shocks in both hands.  She feels diffusely weak but then also tells me she feels weaker on the right side.  She denies any fevers or vision changes.  However she has been feeling dizzy and feeling like she is walking in a zigzag.  She has been having a headache along with this neck pain.  She is tried ibuprofen, Tylenol, aspirin and heat.  Rates the pain as a severe 10/10.  History reviewed. No pertinent past medical history.  There are no problems to display for this patient.   Past Surgical History:  Procedure Laterality Date   STOMACH SURGERY  2012   weight loss surgery     OB History   No obstetric history on file.     History reviewed. No pertinent family history.  Social History   Tobacco Use   Smoking status: Never   Smokeless tobacco: Never    Home Medications Prior to Admission medications   Medication Sig Start Date End Date Taking? Authorizing Provider  HYDROcodone-acetaminophen (NORCO) 5-325 MG tablet Take 1 tablet by mouth every 4 (four) hours as needed. 01/10/21  Yes Pricilla Loveless, MD  ibuprofen (ADVIL) 400 MG tablet Take 1 tablet (400 mg total) by mouth every 8 (eight) hours as needed for mild pain or moderate pain. 01/10/21  Yes Pricilla Loveless, MD  methocarbamol (ROBAXIN) 500 MG tablet Take 1 tablet (500 mg total) by mouth 2 (two) times  daily as needed for muscle spasms. 01/10/21  Yes Pricilla Loveless, MD    Allergies    Patient has no known allergies.  Review of Systems   Review of Systems  Constitutional:  Negative for fever.  Eyes:  Negative for visual disturbance.  Cardiovascular:  Negative for chest pain.  Musculoskeletal:  Positive for neck pain.  Neurological:  Positive for dizziness, weakness, numbness and headaches.  All other systems reviewed and are negative.  Physical Exam Updated Vital Signs BP 117/61 (BP Location: Right Arm)   Pulse (!) 47   Temp 99.1 F (37.3 C) (Oral)   Resp 18   Ht 5' 4.96" (1.65 m)   Wt 87 kg   SpO2 97%   BMI 31.96 kg/m   Physical Exam Vitals and nursing note reviewed.  Constitutional:      General: She is not in acute distress.    Appearance: She is well-developed. She is not ill-appearing or diaphoretic.  HENT:     Head: Normocephalic and atraumatic.     Right Ear: External ear normal.     Left Ear: External ear normal.     Nose: Nose normal.  Eyes:     General:        Right eye: No discharge.        Left eye: No discharge.     Extraocular Movements: Extraocular movements intact.  Pupils: Pupils are equal, round, and reactive to light.  Neck:   Cardiovascular:     Rate and Rhythm: Normal rate and regular rhythm.     Heart sounds: Normal heart sounds.  Pulmonary:     Effort: Pulmonary effort is normal.     Breath sounds: Normal breath sounds.  Abdominal:     Palpations: Abdomen is soft.     Tenderness: There is no abdominal tenderness.  Musculoskeletal:     Cervical back: Neck supple. Muscular tenderness present. No spinous process tenderness.  Skin:    General: Skin is warm and dry.  Neurological:     Mental Status: She is alert.     Comments: CN 3-12 grossly intact. 5/5 strength in LUE, RLE, LLE. Unclear if she is weak in the RUE or if it is too painful as she indicates it's both.   Psychiatric:        Mood and Affect: Mood is not anxious.    ED  Results / Procedures / Treatments   Labs (all labs ordered are listed, but only abnormal results are displayed) Labs Reviewed  CBC - Abnormal; Notable for the following components:      Result Value   Hemoglobin 11.1 (*)    All other components within normal limits  COMPREHENSIVE METABOLIC PANEL - Abnormal; Notable for the following components:   Calcium 8.4 (*)    Anion gap 3 (*)    All other components within normal limits  URINALYSIS, ROUTINE W REFLEX MICROSCOPIC - Abnormal; Notable for the following components:   Glucose, UA >=500 (*)    Nitrite POSITIVE (*)    All other components within normal limits  URINALYSIS, MICROSCOPIC (REFLEX) - Abnormal; Notable for the following components:   Bacteria, UA MANY (*)    All other components within normal limits  PROTIME-INR  APTT  DIFFERENTIAL  RAPID URINE DRUG SCREEN, HOSP PERFORMED    EKG EKG Interpretation  Date/Time:  Saturday January 10 2021 11:26:14 EDT Ventricular Rate:  71 PR Interval:  148 QRS Duration: 86 QT Interval:  398 QTC Calculation: 432 R Axis:   41 Text Interpretation: Normal sinus rhythm no acute ST/T changes No old tracing to compare Confirmed by Pricilla LovelessGoldston, Veryl Winemiller [phone]([id number]) on 01/10/2021 3:41:50 PM  Radiology CT HEAD WO CONTRAST  Result Date: 01/10/2021 CLINICAL DATA:  Left-sided pain and numbness since Tuesday, getting worse. EXAM: CT HEAD WITHOUT CONTRAST TECHNIQUE: Contiguous axial images were obtained from the base of the skull through the vertex without intravenous contrast. COMPARISON:  None. FINDINGS: Brain: No evidence of acute infarction, hemorrhage, hydrocephalus, extra-axial collection or mass lesion/mass effect. Suspected mild deep white matter microangiopathy. Vascular: No hyperdense vessel or unexpected calcification. Skull: Normal. Negative for fracture or focal lesion. Sinuses/Orbits: No acute finding. Other: None. IMPRESSION: 1. No acute intracranial abnormality. 2. Suspected mild deep white matter  microangiopathy. Electronically Signed   By: Ted Mcalpineobrinka  Dimitrova M.D.   On: 01/10/2021 12:05   CT CERVICAL SPINE WO CONTRAST  Result Date: 01/10/2021 CLINICAL DATA:  Acute neck pain. EXAM: CT CERVICAL SPINE WITHOUT CONTRAST TECHNIQUE: Multidetector CT imaging of the cervical spine was performed without intravenous contrast. Multiplanar CT image reconstructions were also generated. COMPARISON:  None. FINDINGS: Alignment: Reversal of cervical lordosis, likely positional. Skull base and vertebrae: No acute fracture. Multiple apparent lucencies throughout the vertebral bodies of the cervical spine. These may be due to osteopenia or represent lytic bone lesions. Soft tissues and spinal canal: No prevertebral fluid or swelling. No visible canal  hematoma. Disc levels:  Multilevel osteoarthritic changes, mild. Upper chest: Negative. Other: None. IMPRESSION: 1. No evidence of acute traumatic injury to the cervical spine. 2. Multiple apparent lucencies throughout the vertebral bodies of the cervical spine. These may be due to osteopenia or represent lytic bone lesions. Clinical correlation is recommended. 3. Multilevel osteoarthritic changes of the cervical spine. Electronically Signed   By: Ted Mcalpine M.D.   On: 01/10/2021 12:08   MR Brain W and Wo Contrast  Result Date: 01/10/2021 CLINICAL DATA:  Initial evaluation for dizziness, neck pain, headache, right upper extremity paresthesias and weakness. EXAM: MRI HEAD WITHOUT AND WITH CONTRAST MRI CERVICAL SPINE WITHOUT AND WITH CONTRAST TECHNIQUE: Multiplanar, multiecho pulse sequences of the brain and surrounding structures, and cervical spine, to include the craniocervical junction and cervicothoracic junction, were obtained without and with intravenous contrast. CONTRAST:  8.4mL GADAVIST GADOBUTROL 1 MMOL/ML IV SOLN COMPARISON:  CT from earlier the same day. FINDINGS: MRI HEAD FINDINGS Brain: Cerebral volume within normal limits. Scattered patchy T2/FLAIR  hyperintensity seen involving the periventricular and deep white matter both cerebral hemispheres, nonspecific, but most like related to chronic microvascular ischemic disease, mild in nature. No abnormal foci of restricted diffusion to suggest acute or subacute ischemia. Gray-white matter differentiation maintained. No encephalomalacia to suggest chronic cortical infarction. No evidence for acute or chronic intracranial hemorrhage. No mass lesion, midline shift or mass effect. No hydrocephalus or extra-axial fluid collection. Partially empty sella. Suprasellar region normal. Midline structures intact. No abnormal enhancement. Vascular: Major intracranial vascular flow voids are maintained. Skull and upper cervical spine: Craniocervical junction within normal limits. Bone marrow signal intensity normal. No worrisome focal marrow replacing lesion. No scalp soft tissue abnormality. Sinuses/Orbits: Globes and orbital soft tissues within normal limits. Scattered mucosal thickening noted throughout the paranasal sinuses. No air-fluid levels to suggest acute sinusitis. Trace bilateral mastoid effusions, of doubtful significance. Inner ear structures grossly normal. Visualized nasopharynx unremarkable. Other: 5 mm T2 hyperintense lesion noted within the right parotid gland, of doubtful significance. MRI CERVICAL SPINE FINDINGS Alignment: Straightening of the normal cervical lordosis. No listhesis. Vertebrae: Vertebral body height maintained without acute or chronic fracture. Bone marrow signal intensity mildly heterogeneous with a few scattered benign hemangiomata noted. No worrisome osseous lesions. No abnormal marrow edema or enhancement. Cord: Normal signal and morphology.  No abnormal enhancement. Posterior Fossa, vertebral arteries, paraspinal tissues: Visualized brain and posterior fossa within normal limits. Craniocervical junction normal. Paraspinous and prevertebral soft tissues within normal limits. Normal  intravascular flow voids seen within the vertebral arteries bilaterally. Disc levels: C2-C3: Unremarkable. C3-C4: Degenerative intervertebral disc space narrowing. Left paracentral disc osteophyte complex indents the left ventral thecal sac. Secondary mild flattening of the left ventral cord without cord signal changes. Mild to moderate spinal stenosis. Mild to moderate left C4 foraminal stenosis. Right neural foramina remains patent. C4-C5: Degenerative intervertebral disc space narrowing. Broad-based posterior disc osteophyte complex flattens and effaces the ventral thecal sac. Moderate spinal stenosis with mild cord flattening, but no cord signal changes. Mild left C5 foraminal stenosis. Right neural foramina remains patent. C5-C6: Degenerative intervertebral disc space narrowing. Left paracentral disc osteophyte complex indents the left ventral thecal sac. Mild spinal stenosis with minimal cord flattening, but no cord signal changes. Mild to moderate left C6 foraminal narrowing. Right neural foramina remains patent. C6-C7: Degenerative intervertebral disc space narrowing. Broad-based posterior disc osteophyte flattens and partially faces the ventral thecal sac with no more than mild spinal stenosis. No cord impingement. Mild left C7 foraminal  stenosis. Right neural foramina remains patent. C7-T1: Negative interspace. Bilateral facet hypertrophy. No stenosis. T1-2: Seen only on sagittal projection. Minimal disc bulge with facet hypertrophy. No stenosis. IMPRESSION: MRI HEAD IMPRESSION: 1. No acute intracranial abnormality. 2. Mild cerebral white matter changes, nonspecific, but most like related to chronic microvascular ischemic disease. MRI CERVICAL SPINE IMPRESSION: 1. No acute abnormality within the cervical spine. No worrisome osseous lesions by MRI. 2. Moderate multilevel cervical spondylosis with resultant mild to moderate diffuse spinal stenosis at C3-4 through C6-7. 3. Mild to moderate left C4 through C7  foraminal stenosis as above. No significant right-sided foraminal narrowing to explain patient's right-sided symptoms. Electronically Signed   By: Rise Mu M.D.   On: 01/10/2021 20:23   MR Cervical Spine W or Wo Contrast  Result Date: 01/10/2021 CLINICAL DATA:  Initial evaluation for dizziness, neck pain, headache, right upper extremity paresthesias and weakness. EXAM: MRI HEAD WITHOUT AND WITH CONTRAST MRI CERVICAL SPINE WITHOUT AND WITH CONTRAST TECHNIQUE: Multiplanar, multiecho pulse sequences of the brain and surrounding structures, and cervical spine, to include the craniocervical junction and cervicothoracic junction, were obtained without and with intravenous contrast. CONTRAST:  8.80mL GADAVIST GADOBUTROL 1 MMOL/ML IV SOLN COMPARISON:  CT from earlier the same day. FINDINGS: MRI HEAD FINDINGS Brain: Cerebral volume within normal limits. Scattered patchy T2/FLAIR hyperintensity seen involving the periventricular and deep white matter both cerebral hemispheres, nonspecific, but most like related to chronic microvascular ischemic disease, mild in nature. No abnormal foci of restricted diffusion to suggest acute or subacute ischemia. Gray-white matter differentiation maintained. No encephalomalacia to suggest chronic cortical infarction. No evidence for acute or chronic intracranial hemorrhage. No mass lesion, midline shift or mass effect. No hydrocephalus or extra-axial fluid collection. Partially empty sella. Suprasellar region normal. Midline structures intact. No abnormal enhancement. Vascular: Major intracranial vascular flow voids are maintained. Skull and upper cervical spine: Craniocervical junction within normal limits. Bone marrow signal intensity normal. No worrisome focal marrow replacing lesion. No scalp soft tissue abnormality. Sinuses/Orbits: Globes and orbital soft tissues within normal limits. Scattered mucosal thickening noted throughout the paranasal sinuses. No air-fluid levels  to suggest acute sinusitis. Trace bilateral mastoid effusions, of doubtful significance. Inner ear structures grossly normal. Visualized nasopharynx unremarkable. Other: 5 mm T2 hyperintense lesion noted within the right parotid gland, of doubtful significance. MRI CERVICAL SPINE FINDINGS Alignment: Straightening of the normal cervical lordosis. No listhesis. Vertebrae: Vertebral body height maintained without acute or chronic fracture. Bone marrow signal intensity mildly heterogeneous with a few scattered benign hemangiomata noted. No worrisome osseous lesions. No abnormal marrow edema or enhancement. Cord: Normal signal and morphology.  No abnormal enhancement. Posterior Fossa, vertebral arteries, paraspinal tissues: Visualized brain and posterior fossa within normal limits. Craniocervical junction normal. Paraspinous and prevertebral soft tissues within normal limits. Normal intravascular flow voids seen within the vertebral arteries bilaterally. Disc levels: C2-C3: Unremarkable. C3-C4: Degenerative intervertebral disc space narrowing. Left paracentral disc osteophyte complex indents the left ventral thecal sac. Secondary mild flattening of the left ventral cord without cord signal changes. Mild to moderate spinal stenosis. Mild to moderate left C4 foraminal stenosis. Right neural foramina remains patent. C4-C5: Degenerative intervertebral disc space narrowing. Broad-based posterior disc osteophyte complex flattens and effaces the ventral thecal sac. Moderate spinal stenosis with mild cord flattening, but no cord signal changes. Mild left C5 foraminal stenosis. Right neural foramina remains patent. C5-C6: Degenerative intervertebral disc space narrowing. Left paracentral disc osteophyte complex indents the left ventral thecal sac. Mild spinal stenosis with minimal  cord flattening, but no cord signal changes. Mild to moderate left C6 foraminal narrowing. Right neural foramina remains patent. C6-C7: Degenerative  intervertebral disc space narrowing. Broad-based posterior disc osteophyte flattens and partially faces the ventral thecal sac with no more than mild spinal stenosis. No cord impingement. Mild left C7 foraminal stenosis. Right neural foramina remains patent. C7-T1: Negative interspace. Bilateral facet hypertrophy. No stenosis. T1-2: Seen only on sagittal projection. Minimal disc bulge with facet hypertrophy. No stenosis. IMPRESSION: MRI HEAD IMPRESSION: 1. No acute intracranial abnormality. 2. Mild cerebral white matter changes, nonspecific, but most like related to chronic microvascular ischemic disease. MRI CERVICAL SPINE IMPRESSION: 1. No acute abnormality within the cervical spine. No worrisome osseous lesions by MRI. 2. Moderate multilevel cervical spondylosis with resultant mild to moderate diffuse spinal stenosis at C3-4 through C6-7. 3. Mild to moderate left C4 through C7 foraminal stenosis as above. No significant right-sided foraminal narrowing to explain patient's right-sided symptoms. Electronically Signed   By: Rise Mu M.D.   On: 01/10/2021 20:23    Procedures Procedures   Medications Ordered in ED Medications  HYDROmorphone (DILAUDID) injection 1 mg (1 mg Intravenous Given 01/10/21 1626)  sodium chloride 0.9 % bolus 1,000 mL (0 mLs Intravenous Stopped 01/10/21 1626)  gadobutrol (GADAVIST) 1 MMOL/ML injection 8.7 mL (8.7 mLs Intravenous Contrast Given 01/10/21 1838)    ED Course  I have reviewed the triage vital signs and the nursing notes.  Pertinent labs & imaging results that were available during my care of the patient were reviewed by me and considered in my medical decision making (see chart for details).    MDM Rules/Calculators/A&P                           Patient's labs are overall unremarkable but given her neck pain and other various symptoms, MRIs were obtained.  There is no obvious mass.  Does appear to have spinal stenosis which I suspect is the cause of  her severe neck pain.  After treatment in the ED she is much better and her right arm seems to be moving a lot better, I suspect it was never truly weak but more just painful.  However there is no emergent finding that would require an emergent surgery.  She can follow-up with neurosurgery as an outpatient.  She was also noted to have nitrites in her urine but has not been having obvious urinary symptoms and so I think this is probably asymptomatic.  Will discharge home with pain control return precautions. Final Clinical Impression(s) / ED Diagnoses Final diagnoses:  Cervical spinal stenosis    Rx / DC Orders ED Discharge Orders          Ordered    HYDROcodone-acetaminophen (NORCO) 5-325 MG tablet  Every 4 hours PRN        01/10/21 2057    ibuprofen (ADVIL) 400 MG tablet  Every 8 hours PRN        01/10/21 2057    methocarbamol (ROBAXIN) 500 MG tablet  2 times daily PRN        01/10/21 2057             Pricilla Loveless, MD 01/10/21 2318

## 2021-01-10 NOTE — ED Notes (Signed)
Pt in MRI.

## 2021-01-10 NOTE — ED Provider Notes (Signed)
Emergency Medicine Provider Triage Evaluation Note  Teresa Key , a 65 y.o. female  was evaluated in triage.  Pt complains of neck pain, headache, and right-sided upper extremity paresthesias and weakness for 4 days.  States that she was washing her leg when she turned her neck and had a sharp shooting pain in her neck.  Has worsened since that time she additionally endorses intermittent blurry vision and disequilibrium when walking.  She is not anticoagulated and has no history of CVA.Marland Kitchen  Review of Systems  Positive: Blurred vision, dizziness, lightheadedness, disequilibrium, headache, neck pain, bilateral upper extremity weakness, right arm tingling Negative: Fevers, chills, trauma  Physical Exam  BP 101/68 (BP Location: Left Arm)   Pulse 71   Temp 99.1 F (37.3 C) (Oral)   Resp 18   Ht 5' 4.96" (1.65 m)   Wt 87 kg   SpO2 97%   BMI 31.96 kg/m  Gen:   Awake, no distress   Resp:  Normal effort  MSK:   Moves extremities without difficulty  Other:  Symmetric weak grip strength, unilateral weakness in the right upper extremity with pulling motion relative to the left.  Cranial nerves are intact, EOMI, PERRL  Medical Decision Making  Medically screening exam initiated at 11:06 AM.  Appropriate orders placed.  Teresa Key was informed that the remainder of the evaluation will be completed by another provider, this initial triage assessment does not replace that evaluation, and the importance of remaining in the ED until their evaluation is complete.  We will proceed with stroke evaluation, however last known well greater than 4 days ago.  Code stroke not activated. This chart was dictated using voice recognition software, Dragon. Despite the best efforts of this provider to proofread and correct errors, errors may still occur which can change documentation meaning.      Sherrilee Gilles 01/10/21 1118    Maia Plan, MD 01/18/21  [phone]

## 2021-01-10 NOTE — ED Triage Notes (Addendum)
Patient arrives with daughter who states that the patient turned her neck wrong 4 days ago and has developed worsening radiating neck pain and left sided headache (feels like she is being electrocuted).   Patient has been having difficulty walking. Rates pain a 10/10.  Spanish-Speaking, accompanied by granddaughter.

## 2021-01-10 NOTE — Discharge Instructions (Signed)
If you develop worsening, recurrent, or continued neck pain, numbness or weakness in the arms or legs, incontinence of your bowels or bladders, numbness of your buttocks, fever, chest pain, headache, or any other new/concerning symptoms then return to the ER for evaluation.   Si presenta dolor de cuello que empeora, recurrente o continuo, entumecimiento o debilidad en los brazos o las piernas, incontinencia intestinal o vesical, entumecimiento de las [place], fiebre, Engineer, mining en el pecho, dolor de cabeza o cualquier otro sntoma nuevo o preocupante, vuelva a la sala de emergencias para su evaluacin.

## 2021-01-15 ENCOUNTER — Encounter: Payer: Self-pay | Admitting: Physician Assistant

## 2021-01-15 ENCOUNTER — Other Ambulatory Visit: Payer: Self-pay

## 2021-01-15 ENCOUNTER — Ambulatory Visit: Payer: Self-pay | Attending: Physician Assistant | Admitting: Physician Assistant

## 2021-01-15 VITALS — BP 103/68 | HR 63 | Ht 65.0 in | Wt 195.2 lb

## 2021-01-15 DIAGNOSIS — R5383 Other fatigue: Secondary | ICD-10-CM

## 2021-01-15 DIAGNOSIS — M519 Unspecified thoracic, thoracolumbar and lumbosacral intervertebral disc disorder: Secondary | ICD-10-CM

## 2021-01-15 DIAGNOSIS — Z09 Encounter for follow-up examination after completed treatment for conditions other than malignant neoplasm: Secondary | ICD-10-CM

## 2021-01-15 DIAGNOSIS — M9979 Connective tissue and disc stenosis of intervertebral foramina of abdomen and other regions: Secondary | ICD-10-CM

## 2021-01-15 DIAGNOSIS — R9389 Abnormal findings on diagnostic imaging of other specified body structures: Secondary | ICD-10-CM

## 2021-01-15 DIAGNOSIS — R937 Abnormal findings on diagnostic imaging of other parts of musculoskeletal system: Secondary | ICD-10-CM

## 2021-01-15 DIAGNOSIS — M4800 Spinal stenosis, site unspecified: Secondary | ICD-10-CM

## 2021-01-15 DIAGNOSIS — R8281 Pyuria: Secondary | ICD-10-CM

## 2021-01-15 LAB — POCT URINALYSIS DIP (CLINITEK)
Bilirubin, UA: NEGATIVE
Blood, UA: NEGATIVE
Glucose, UA: 100 mg/dL — AB
Leukocytes, UA: NEGATIVE
Nitrite, UA: POSITIVE — AB
POC PROTEIN,UA: NEGATIVE
Spec Grav, UA: >=1.03 — AB (ref 1.010–1.025)
Urobilinogen, UA: 0.2 E.U./dL
pH, UA: 5.5 (ref 5.0–8.0)

## 2021-01-15 MED ORDER — SULFAMETHOXAZOLE-TRIMETHOPRIM 800-160 MG PO TABS
1.0000 | ORAL_TABLET | Freq: Two times a day (BID) | ORAL | 0 refills | Status: AC
  Filled 2021-01-15: qty 10, 5d supply, fill #0

## 2021-01-15 MED ORDER — METHOCARBAMOL 500 MG PO TABS
500.0000 mg | ORAL_TABLET | Freq: Four times a day (QID) | ORAL | 0 refills | Status: AC | PRN
  Filled 2021-01-15: qty 60, 15d supply, fill #0

## 2021-01-15 MED ORDER — MELOXICAM 15 MG PO TABS
15.0000 mg | ORAL_TABLET | Freq: Every day | ORAL | 0 refills | Status: AC
Start: 2021-01-15 — End: ?
  Filled 2021-01-15: qty 30, 30d supply, fill #0

## 2021-01-15 NOTE — Progress Notes (Signed)
Granddaughter is translating

## 2021-01-15 NOTE — Patient Instructions (Signed)
Bacteriuria asintomtica Asymptomatic Bacteriuria La bacteriuria asintomtica es la presencia de un gran nmero de bacterias en la orina, sin los sntomas habituales de ardor o deseos frecuentes de Geographical information systems officer. Por lo general, no es perjudicial, y es posible que no sea necesario Pensions consultant. Una persona con esta afeccin no tendr ms probabilidades de tener una infeccin en el futuro. Cules son las causas? La causa de esta afeccin es el aumento de las bacterias en la orina. Este incremento puede estar originado por algunas de las siguientes causas: El ingreso de una bacteria en las vas urinarias, por ejemplo durante las relaciones sexuales. Una obstruccin en las vas urinarias, debido a clculos renales o un tumor. Problemas en la vejiga que impiden su vaciamiento. Qu incrementa el riesgo? Es ms probable que sufra esta afeccin si: Tiene diabetes. Es un Environmental consultant. Esto afecta especialmente a los [employer] en centros de Counsellor. Est embarazada y est en Financial risk analyst trimestre. Tiene clculos renales. Es mujer. Ha tenido un trasplante de rin. Tiene una falla valvular renal que produce una filtracin (reflujo). Tuvo un catter urinario [employer]. Esto es un tubo largo y delgado que recoge la orina. Cules son los signos o sntomas? Esta afeccin no presenta sntomas. Cmo se diagnostica? Esta afeccin se diagnostica con un anlisis de [place]. Debido a que esta afeccin no manifiesta sntomas, normalmente se diagnostica cuando se toma [place] para tratar o diagnosticar otro cuadro clnico, como un embarazo o problemas renales. A la mayora de las embarazadas se les hace la prueba de deteccin de la bacteriuria asintomtica durante Financial risk analyst trimestre. Cmo se trata? Generalmente, no se requiere un tratamiento para esta afeccin. El tratamiento de esta afeccin puede ocasionar otros [place], tales como una infeccin por hongos o el  desarrollo de bacterias que no responden al tratamiento (bacterias resistentes a los antibiticos). Algunas personas necesitan tratamiento con antibiticos para evitar una infeccin renal, conocida como pielonefritis. Se requiere tratamiento en los siguientes casos: Est embarazada. En las mujeres [place], una infeccin renal puede causar lo siguiente: Parto anticipado (parto prematuro). Muy bajo peso del beb al nacer (restriccin del desarrollo fetal). Muerte del recin nacido. Le estn realizando un procedimiento que afecta las vas urinarias. Ha tenido un trasplante de rin. Si le diagnostican esta afeccin, hable con el mdico acerca de cualquier inquietud que tenga. Siga estas instrucciones en su casa: Medicamentos Use los medicamentos de venta libre y los recetados solamente como se lo haya indicado el mdico. Si le recetaron un antibitico, tmelo como se lo haya indicado el mdico. No deje de usar el antibitico aunque comience a Actor. Instrucciones generales Controle su afeccin para ver si hay cambios. Beber suficiente lquido como para Pharmacologist la orina de color amarillo plido. Orine con ms frecuencia para mantener la vejiga vaca. Si es mujer, mantenga limpia la zona que rodea la vagina y Administrator. Despus de Automotive engineer, higiencese de [employer]. Use cada trozo de papel higinico una sola vez. Cumpla con todas las visitas de seguimiento. Esto es importante. Comunquese con un mdico si: Tiene sntomas de una infeccin urinaria, por ejemplo, los siguientes: Sensacin de ardor o dolor al Geographical information systems officer. Necesidad imperiosa de Geographical information systems officer, u orinar con ms frecuencia. Orina que cambia de color o se pone turbia. Presenta sangre en la orina. Orina con mal olor. Solicite ayuda de inmediato si: Tiene signos de infeccin renal, por ejemplo, los siguientes: Dolor plvico o de espalda. Fiebre o escalofros. Nuseas  o vmitos. Dolor intenso que no puede controlar  con los [employer]. Resumen La bacteriuria asintomtica es la presencia de un gran nmero de bacterias en la orina, sin los sntomas habituales de ardor o deseos frecuentes de Geographical information systems officer. Generalmente, no se requiere un tratamiento para esta afeccin. El tratamiento de esta afeccin puede ocasionar otros problemas, tales como una infeccin por hongos o la proliferacin de bacterias que no responden al [place]. Algunas Haematologist. Es necesario realizar tratamiento si est embarazada, si se va a someter a un procedimiento que afecta las vas urinarias o si ha tenido un trasplante de rin. Si le recetaron un antibitico, tmelo como se lo haya indicado el mdico. No deje de usar el antibitico aunque comience a Actor. Esta informacin no tiene Theme park manager el consejo del mdico. Asegrese de hacerle al mdico cualquier pregunta que tenga. Document Revised: 02/12/2020 Document Reviewed: 02/12/2020 Elsevier Patient Education  2022 [employer].

## 2021-01-15 NOTE — Progress Notes (Signed)
Patient ID: Teresa Key, female   DOB: [birth year], 65 y.o.   MRN: [id number]        Teresa Key, is a 65 y.o. female  [id number]  [id number]  DOB - 08-14-1955  Chief Complaint  Patient presents with   Hospitalization Follow-up       Subjective:   Teresa Key is a 65 y.o. female here today for a follow up visit and to establish care after being seen at the ED 01/10/2021.  Still having neck and head pain and did not get prescriptions that were sent from the ED.  She still denies any urinary s/sx but has +nitrites today   Patient has No headache, No chest pain, No abdominal pain - No Nausea, No new weakness tingling or numbness, No Cough - SOB.  They declined interpreter and her daughter is translating  From ED note: After being seen in the ED 01/10/2021 for back and neck pain and had asymptomatic pyuria.    IMPRESSION: MRI HEAD IMPRESSION:   1. No acute intracranial abnormality. 2. Mild cerebral white matter changes, nonspecific, but most like related to chronic microvascular ischemic disease.   MRI CERVICAL SPINE IMPRESSION:   1. No acute abnormality within the cervical spine. No worrisome osseous lesions by MRI. 2. Moderate multilevel cervical spondylosis with resultant mild to moderate diffuse spinal stenosis at C3-4 through C6-7. 3. Mild to moderate left C4 through C7 foraminal stenosis as above. No significant right-sided foraminal narrowing to explain patient's right-sided symptoms.  CT head without contrast: IMPRESSION: MRI HEAD IMPRESSION:   1. No acute intracranial abnormality. 2. Mild cerebral white matter changes, nonspecific, but most like related to chronic microvascular ischemic disease.   MRI CERVICAL SPINE IMPRESSION:   1. No acute abnormality within the cervical spine. No worrisome osseous lesions by MRI. 2. Moderate multilevel cervical spondylosis with resultant mild to moderate diffuse spinal  stenosis at C3-4 through C6-7. 3. Mild to moderate left C4 through C7 foraminal stenosis as above. No significant right-sided foraminal narrowing to explain patient's right-sided symptoms.  CT c-spine without contrast: IMPRESSION: 1. No evidence of acute traumatic injury to the cervical spine. 2. Multiple apparent lucencies throughout the vertebral bodies of the cervical spine. These may be due to osteopenia or represent lytic bone lesions. Clinical correlation is recommended. 3. Multilevel osteoarthritic changes of the cervical spine.  No problems updated.  ALLERGIES: No Known Allergies  PAST MEDICAL HISTORY: History reviewed. No pertinent past medical history.  MEDICATIONS AT HOME: Prior to Admission medications   Medication Sig Start Date End Date Taking? Authorizing Provider  HYDROcodone-acetaminophen (NORCO) 5-325 MG tablet Take 1 tablet by mouth every 4 (four) hours as needed. 01/10/21  Yes Pricilla Loveless, MD  meloxicam (MOBIC) 15 MG tablet Take 1 tablet (15 mg total) by mouth daily. 01/15/21  Yes Georgian Co M, PA-C  sulfamethoxazole-trimethoprim (BACTRIM DS) 800-160 MG tablet Take 1 tablet by mouth 2 (two) times daily. 01/15/21  Yes Maximilliano Kersh, Teresa Schlein, PA-C  methocarbamol (ROBAXIN) 500 MG tablet Take 1 tablet (500 mg total) by mouth every 6 (six) hours as needed for muscle spasms. 01/15/21   Teresa Key, Teresa Schlein, PA-C    ROS: Neg HEENT Neg resp Neg cardiac Neg GI Neg GU Neg psych Neg neuro  Objective:   Vitals:   01/15/21 1427  BP: 103/68  Pulse: 63  SpO2: 96%  Weight: 195 lb 4 oz (88.6 kg)  Height: 5\' 5"  (1.651 m)   Exam General appearance : Awake,  alert, not in any distress. Speech Clear. Not toxic looking HEENT: Atraumatic and Normocephalic Neck: Supple, no JVD. No cervical lymphadenopathy.  Chest: Good air entry bilaterally, CTAB.  No rales/rhonchi/wheezing CVS: S1 S2 regular, no murmurs.  See back and neck exam from ED-no changes today Extremities:  B/L Lower Ext shows no edema, both legs are warm to touch Neurology: Awake alert, and oriented X 3, CN II-XII intact, Non focal Skin: No Rash  Data Review No results found for: HGBA1C  Assessment & Plan   1. Pyuria Increase water intake - POCT URINALYSIS DIP (CLINITEK) - Urine Culture - sulfamethoxazole-trimethoprim (BACTRIM DS) 800-160 MG tablet; Take 1 tablet by mouth 2 (two) times daily.  Dispense: 10 tablet; Refill: 0  2. Spinal stenosis, unspecified spinal region - Ambulatory referral to Neurosurgery - methocarbamol (ROBAXIN) 500 MG tablet; Take 1 tablet (500 mg total) by mouth every 6 (six) hours as needed for muscle spasms.  Dispense: 60 tablet; Refill: 0 - meloxicam (MOBIC) 15 MG tablet; Take 1 tablet (15 mg total) by mouth daily.  Dispense: 30 tablet; Refill: 0  3. Abnormal MRI - Ambulatory referral to Neurosurgery  4. 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  Dispense: 30 tablet; Refill: 0  5. Abnormal CT of spine - Ambulatory referral to Neurosurgery  6. Fatigue, unspecified type Previous EDlabs reviewed and unremarkable.  She declines further labs today   7. Encounter for examination following treatment at hospital    Patient have been counseled extensively about nutrition and exercise. Other issues discussed during this visit include: low cholesterol diet, weight control and daily exercise, foot care, annual eye examinations at Ophthalmology, importance of adherence with medications and regular follow-up. We also discussed long term complications of uncontrolled diabetes and hypertension.   Return in about 2 months (around 03/17/2021) for assign PCP.  The patient was given clear instructions to go to ER or return to medical center if symptoms don't improve, worsen or new problems develop. The patient verbalized understanding. The  patient was told to call to get lab results if they haven't heard anything in the next week.      Georgian Co, PA-C [facility] [place], [place] [phone]   01/15/2021, 2:46 PM

## 2021-01-19 ENCOUNTER — Other Ambulatory Visit: Payer: Self-pay

## 2021-01-19 ENCOUNTER — Ambulatory Visit: Payer: Self-pay | Attending: Family Medicine

## 2021-01-21 LAB — URINE CULTURE

## 2021-01-22 ENCOUNTER — Other Ambulatory Visit: Payer: Self-pay

## 2021-02-04 ENCOUNTER — Telehealth: Payer: Self-pay

## 2021-02-04 NOTE — Telephone Encounter (Signed)
Pt was sent a letter from financial dept. Inform them, that the application they submitted was incomplete, since they were missing some documentation at the time of the appointment, Pt need to reschedule and resubmit all new papers and application for [employer] and [facility], P.S. old documents has been sent back by mail to the Pt and Pt. need to make a new appt. 

## 2021-03-24 ENCOUNTER — Ambulatory Visit: Payer: Self-pay | Admitting: Critical Care Medicine

## 2021-03-24 NOTE — Progress Notes (Incomplete)
New Patient Office Visit  Subjective:  Patient ID: Teresa Key, female    DOB: 06/15/1955  Age: 65 y.o. MRN: [id number]  CC: No chief complaint on file.   HPI Teresa Key presents for *** 9/15 1. Pyuria Increase water intake - POCT URINALYSIS DIP (CLINITEK) - Urine Culture - sulfamethoxazole-trimethoprim (BACTRIM DS) 800-160 MG tablet; Take 1 tablet by mouth 2 (two) times daily.  Dispense: 10 tablet; Refill: 0   2. Spinal stenosis, unspecified spinal region - Ambulatory referral to Neurosurgery - methocarbamol (ROBAXIN) 500 MG tablet; Take 1 tablet (500 mg total) by mouth every 6 (six) hours as needed for muscle spasms.  Dispense: 60 tablet; Refill: 0 - meloxicam (MOBIC) 15 MG tablet; Take 1 tablet (15 mg total) by mouth daily.  Dispense: 30 tablet; Refill: 0   3. Abnormal MRI - Ambulatory referral to Neurosurgery   4. 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  Dispense: 30 tablet; Refill: 0   5. Abnormal CT of spine - Ambulatory referral to Neurosurgery   6. Fatigue, unspecified type Previous EDlabs reviewed and unremarkable.  She declines further labs today   7. Encounter for examination following treatment at hospital   No past medical history on file.  Past Surgical History:  Procedure Laterality Date   STOMACH SURGERY  2012   weight loss surgery    No family history on file.  Social History   Socioeconomic History   Marital status: Single    Spouse name: Not on file   Number of children: Not on file   Years of education: Not on file   Highest education level: Not on file  Occupational History   Not on file  Tobacco Use   Smoking status: Never   Smokeless tobacco: Never  Substance and Sexual Activity   Alcohol use: Not on file   Drug use: Not on file   Sexual activity: Not  on file  Other Topics Concern   Not on file  Social History Narrative   Not on file   Social Determinants of Health   Financial Resource Strain: Not on file  Food Insecurity: Not on file  Transportation Needs: Not on file  Physical Activity: Not on file  Stress: Not on file  Social Connections: Not on file  Intimate Partner Violence: Not on file    ROS Review of Systems  Objective:   Today's Vitals: There were no vitals taken for this visit.  Physical Exam IMPRESSION: MRI HEAD IMPRESSION:   1. No acute intracranial abnormality. 2. Mild cerebral white matter changes, nonspecific, but most like related to chronic microvascular ischemic disease.   MRI CERVICAL SPINE IMPRESSION:   1. No acute abnormality within the cervical spine. No worrisome osseous lesions by MRI. 2. Moderate multilevel cervical spondylosis with resultant mild to moderate diffuse spinal stenosis at C3-4 through C6-7. 3. Mild to moderate left C4 through C7 foraminal stenosis as above. No significant right-sided foraminal narrowing to explain patient's right-sided symptoms.   CT head without contrast: IMPRESSION: MRI HEAD IMPRESSION:   1. No acute intracranial abnormality. 2. Mild cerebral white matter changes, nonspecific, but most like related to chronic microvascular ischemic disease.   MRI CERVICAL SPINE IMPRESSION:   1. No acute abnormality within the cervical spine. No worrisome osseous lesions by MRI. 2. Moderate multilevel cervical spondylosis with resultant mild to moderate diffuse  spinal stenosis at C3-4 through C6-7. 3. Mild to moderate left C4 through C7 foraminal stenosis as above. No significant right-sided foraminal narrowing to explain patient's right-sided symptoms.   CT c-spine without contrast: IMPRESSION: 1. No evidence of acute traumatic injury to the cervical spine. 2. Multiple apparent lucencies throughout the vertebral bodies of the cervical spine. These may be  due to osteopenia or represent lytic bone lesions. Clinical correlation is recommended. 3. Multilevel osteoarthritic changes of the cervical spine. Assessment & Plan:   Problem List Items Addressed This Visit   None   Outpatient Encounter Medications as of 03/24/2021  Medication Sig   HYDROcodone-acetaminophen (NORCO) 5-325 MG tablet Take 1 tablet by mouth every 4 (four) hours as needed.   meloxicam (MOBIC) 15 MG tablet Take 1 tablet (15 mg total) by mouth daily.   methocarbamol (ROBAXIN) 500 MG tablet Take 1 tablet (500 mg total) by mouth every 6 (six) hours as needed for muscle spasms.   sulfamethoxazole-trimethoprim (BACTRIM DS) 800-160 MG tablet Take 1 tablet by mouth 2 (two) times daily.   No facility-administered encounter medications on file as of 03/24/2021.    Follow-up: No follow-ups on file.   Asencion Noble, MD
